# Patient Record
Sex: Male | Born: 1960 | Race: White | Hispanic: No | Marital: Married | Smoking: Never smoker
Health system: Southern US, Community
[De-identification: ages and names within clinical notes are randomized; demographics above are authoritative.]

## PROBLEM LIST (undated history)

## (undated) DIAGNOSIS — K7581 Nonalcoholic steatohepatitis (NASH): Secondary | ICD-10-CM

## (undated) DIAGNOSIS — L03115 Cellulitis of right lower limb: Secondary | ICD-10-CM

## (undated) DIAGNOSIS — E785 Hyperlipidemia, unspecified: Secondary | ICD-10-CM

## (undated) DIAGNOSIS — Z1211 Encounter for screening for malignant neoplasm of colon: Secondary | ICD-10-CM

## (undated) HISTORY — DX: Nonalcoholic steatohepatitis (NASH): K75.81

## (undated) HISTORY — DX: Hyperlipidemia, unspecified: E78.5

## (undated) HISTORY — DX: Cellulitis of right lower limb: L03.115

## (undated) HISTORY — DX: Encounter for screening for malignant neoplasm of colon: Z12.11

---

## 2015-04-12 ENCOUNTER — Ambulatory Visit: Payer: Self-pay | Admitting: Nurse Practitioner

## 2015-10-27 ENCOUNTER — Telehealth: Payer: Self-pay | Admitting: General Practice

## 2015-10-27 NOTE — Telephone Encounter (Signed)
Noted  

## 2015-10-27 NOTE — Telephone Encounter (Signed)
Patients previous PCP, [facility] no longer [facility]. I called the office to request records, and I was informed that they do not have a charge for the patient. They asked me to fax the form to [phone]. I am faxing in an attempt to find records that might be in their storage. At this point, however, there are no records to get for this patient.

## 2015-10-27 NOTE — Telephone Encounter (Signed)
Disregard

## 2015-10-27 NOTE — Telephone Encounter (Signed)
FYI

## 2015-10-28 ENCOUNTER — Encounter: Payer: Self-pay | Admitting: Family Medicine

## 2015-10-28 ENCOUNTER — Ambulatory Visit (INDEPENDENT_AMBULATORY_CARE_PROVIDER_SITE_OTHER): Payer: BC Managed Care – PPO | Admitting: Family Medicine

## 2015-10-28 VITALS — BP 123/76 | HR 70 | Temp 98.6°F | Resp 16 | Ht 67.0 in | Wt 206.8 lb

## 2015-10-28 DIAGNOSIS — R69 Illness, unspecified: Principal | ICD-10-CM

## 2015-10-28 DIAGNOSIS — J111 Influenza due to unidentified influenza virus with other respiratory manifestations: Secondary | ICD-10-CM

## 2015-10-28 NOTE — Patient Instructions (Signed)
Get otc generic robitussin DM OR Mucinex DM and use as directed on the packaging for cough and congestion. Use otc generic saline nasal spray 2-3 times per day to irrigate/moisturize your nasal passages.   

## 2015-10-28 NOTE — Progress Notes (Signed)
Office Note 10/28/2015  CC:  Chief Complaint  Patient presents with  . Establish Care  . URI    x 1 week    HPI:  Dean Aguirre is a 55 y.o. White male who is here to establish care and discuss recent resp illness. Patient's most recent primary MD: pt gave info to front office. Old records were not reviewed prior to or during today's visit.  Onset 4 days ago head pressure, some ST, body aches, cough, congestion in head, fever on/off.  Some intermittent feeling of SOB/chest tightness/wheezing.  No n/v/d. No HA, no RASH.  Missed work the last 3 days.  Feels improved last day or so. Nyquil and dayquil.  Drinking plenty of fluids.    Pt denies any PMH or PSH and is currently on no chronic medications. History reviewed. No pertinent past medical history.  History reviewed. No pertinent past surgical history.  Family History  Problem Relation Age of Onset  . Melanoma Father     Social History   Social History  . Marital Status: Married    Spouse Name: N/A  . Number of Children: N/A  . Years of Education: N/A   Occupational History  . Not on file.   Social History Main Topics  . Smoking status: Never Smoker   . Smokeless tobacco: Never Used  . Alcohol Use: Yes  . Drug Use: No  . Sexual Activity: Not on file   Other Topics Concern  . Not on file   Social History Narrative   Married, 2 stepchildren, one lives with him.   Educ: HS   Orig from [place], relocated approx 2012.   Occup: Plumber   No tob, typically has a beer with dinner.      No outpatient encounter prescriptions on file as of 10/28/2015.   No facility-administered encounter medications on file as of 10/28/2015.    No Known Allergies  ROS Review of Systems  Constitutional: Negative for fatigue.       See HPI  HENT:       See HPI  Eyes: Negative for visual disturbance.  Respiratory: Positive for cough.   Cardiovascular: Negative for chest pain.  Gastrointestinal: Negative for nausea and abdominal  pain.  Genitourinary: Negative for dysuria.  Musculoskeletal: Negative for back pain and joint swelling.  Skin: Negative for rash.  Neurological: Negative for weakness and headaches.  Hematological: Negative for adenopathy.    PE; Blood pressure 123/76, pulse 70, temperature 98.6 F (37 C), temperature source Oral, resp. rate 16, height 5\' 7"  (1.702 m), weight 206 lb 12 oz (93.781 kg), SpO2 94 %. VS: noted--normal. Gen: alert, NAD, NONTOXIC APPEARING. HEENT: eyes without injection, drainage, or swelling.  Ears: EACs clear, TMs with normal light reflex and landmarks.  Nose: Clear rhinorrhea, with some dried, crusty exudate adherent to mildly injected mucosa.  No purulent d/c.  No paranasal sinus TTP.  No facial swelling.  Throat and mouth without focal lesion.  No pharyngial swelling, erythema, or exudate.   Neck: supple, no LAD.   LUNGS: CTA bilat, nonlabored resps.   CV: RRR, no m/r/g. ABD: soft, NT/ND, BS normal, no mass/brut/HSM EXT: no c/c/e SKIN: no rash   Pertinent labs:  none  ASSESSMENT AND PLAN:  New pt; try to obtain old records.  1) Influenza-like illness, resolving. No sign of RAD or bacterial infection. Get otc generic robitussin DM OR Mucinex DM and use as directed on the packaging for cough and congestion. Use otc generic saline nasal  spray 2-3 times per day to irrigate/moisturize your nasal passages. Signs/symptoms to call or return for were reviewed and pt expressed understanding.  An After Visit Summary was printed and given to the patient.  Return if symptoms worsen or fail to improve.

## 2015-10-28 NOTE — Progress Notes (Signed)
Pre visit review using our clinic review tool, if applicable. No additional management support is needed unless otherwise documented below in the visit note. 

## 2016-10-22 DIAGNOSIS — K7581 Nonalcoholic steatohepatitis (NASH): Secondary | ICD-10-CM

## 2016-10-22 DIAGNOSIS — L03115 Cellulitis of right lower limb: Secondary | ICD-10-CM

## 2016-10-22 HISTORY — DX: Nonalcoholic steatohepatitis (NASH): K75.81

## 2016-10-22 HISTORY — DX: Cellulitis of right lower limb: L03.115

## 2016-11-12 ENCOUNTER — Ambulatory Visit (INDEPENDENT_AMBULATORY_CARE_PROVIDER_SITE_OTHER): Payer: BC Managed Care – PPO | Admitting: Family Medicine

## 2016-11-12 ENCOUNTER — Encounter: Payer: Self-pay | Admitting: Family Medicine

## 2016-11-12 VITALS — BP 142/84 | HR 119 | Temp 98.4°F | Resp 18 | Wt 219.0 lb

## 2016-11-12 DIAGNOSIS — R509 Fever, unspecified: Secondary | ICD-10-CM | POA: Diagnosis not present

## 2016-11-12 DIAGNOSIS — R52 Pain, unspecified: Secondary | ICD-10-CM | POA: Diagnosis not present

## 2016-11-12 DIAGNOSIS — R5383 Other fatigue: Secondary | ICD-10-CM | POA: Diagnosis not present

## 2016-11-12 DIAGNOSIS — R6889 Other general symptoms and signs: Secondary | ICD-10-CM | POA: Diagnosis not present

## 2016-11-12 LAB — POCT INFLUENZA A/B
Influenza A, POC: NEGATIVE
Influenza B, POC: NEGATIVE

## 2016-11-12 MED ORDER — OSELTAMIVIR PHOSPHATE 75 MG PO CAPS: 75.0000 mg | ORAL_CAPSULE | Freq: Two times a day (BID) | ORAL | 0 refills | Status: DC

## 2016-11-12 NOTE — Progress Notes (Signed)
Pre visit review using our clinic review tool, if applicable. No additional management support is needed unless otherwise documented below in the visit note. 

## 2016-11-12 NOTE — Progress Notes (Addendum)
OFFICE VISIT  11/12/2016   CC:  Chief Complaint  Patient presents with  . Fever    body aches, fatigue x1 day   HPI:    Patient is a 56 y.o.  male who presents for body aches and subjective fever. Onset yesterday morning, felt dizziness, very fatigued.  No cough or ST.  No stuffy nose or runny nose.   No HA.  No SOB.  NO urinary complaints.  No rash.   No swelling of any joints.   Took ibuprofen and alka seltzer tabs today.  No n/v/d.    No known sick contacts. No flu vaccine this season.  History reviewed. No pertinent past medical history.  History reviewed. No pertinent surgical history.  MEDS: none  No Known Allergies  ROS As per HPI  PE: Blood pressure (!) 142/84, pulse (!) 119, temperature 98.4 F (36.9 C), temperature source Temporal, resp. rate 18, weight 219 lb (99.3 kg), SpO2 94 %. VS: noted. Gen: alert, NAD, NONTOXIC APPEARING.  Smiling, occ cracking a joke. HEENT: eyes without injection, drainage, or swelling.  Ears: EACs clear, TMs with normal light reflex and landmarks.  Nose: Clear rhinorrhea, with some dried, crusty exudate adherent to mildly injected mucosa.  No purulent d/c.  No paranasal sinus TTP.  No facial swelling.  Throat and mouth without focal lesion.  No pharyngial swelling, erythema, or exudate.   Neck: supple, no LAD.  No nuchal rigidity. LUNGS: CTA bilat, nonlabored resps.   CV: RRR, no m/r/g. EXT: no c/c/e SKIN: no rash  LABS:  Rapid flu A/B: NEG  IMPRESSION AND PLAN:  Viral syndrome suspected: his only sx's are subjective fever, body aches, and fatigue at this time. No localizing symptoms at this time, but given that we are in the middle of flu season and he is presenting w/in 24-48h of his symptoms onset, most likely dx is influenza-like illness. Given high occurrence of false neg with rapid flu testing, will go ahead and start tamiflu. However, I told him to return for recheck in 2d if he has still has fever but has not developed any  cough or ST.  An After Visit Summary was printed and given to the patient.  FOLLOW UP: Return in about 2 days (around 11/14/2016) for f/u fever.  Signed:  Santiago BumpersPhil McGowen, MD           11/12/2016

## 2016-11-14 ENCOUNTER — Ambulatory Visit (INDEPENDENT_AMBULATORY_CARE_PROVIDER_SITE_OTHER): Payer: BC Managed Care – PPO | Admitting: Family Medicine

## 2016-11-14 ENCOUNTER — Encounter: Payer: Self-pay | Admitting: Family Medicine

## 2016-11-14 VITALS — BP 108/74 | HR 85 | Temp 98.6°F | Resp 16 | Ht 67.0 in | Wt 215.5 lb

## 2016-11-14 DIAGNOSIS — B349 Viral infection, unspecified: Secondary | ICD-10-CM | POA: Diagnosis not present

## 2016-11-14 NOTE — Progress Notes (Signed)
Pre visit review using our clinic review tool, if applicable. No additional management support is needed unless otherwise documented below in the visit note. 

## 2016-11-14 NOTE — Progress Notes (Signed)
OFFICE VISIT  11/14/2016   CC:  Chief Complaint  Patient presents with  . Follow-up    fever     HPI:    Patient is a 56 y.o. Caucasian male who presents for 2 day f/u fever, body aches, and fatigue. He had no respiratory or other symptoms, so I was hesitant to say he had the flu.  At any rate, I did start him on tamiflu and told him to return if he did not get any respiratory or other localizing sx's in 2d. Checking temp regularly and no longer having any temp > 100 and no subjective fever. No new sx's except a little bit of nasal mucous/PND/stuffy head.   No cough or ST or HA.  Body aches and fatigue is improved some. He did NOT start the tamiflu.   History reviewed. No pertinent past medical history.  History reviewed. No pertinent surgical history.  Outpatient Medications Prior to Visit  Medication Sig Dispense Refill  . oseltamivir (TAMIFLU) 75 MG capsule Take 1 capsule (75 mg total) by mouth 2 (two) times daily. (Patient not taking: Reported on 11/14/2016) 10 capsule 0   No facility-administered medications prior to visit.     No Known Allergies  ROS As per HPI  PE: Blood pressure 108/74, pulse 85, temperature 98.6 F (37 C), temperature source Oral, resp. rate 16, height 5\' 7"  (1.702 m), weight 215 lb 8 oz (97.8 kg), SpO2 97 %. VS: noted--normal. Gen: alert, NAD, NONTOXIC APPEARING. HEENT: eyes without injection, drainage, or swelling.  Ears: EACs clear, TMs with normal light reflex and landmarks.  Nose: mildly injected mucosa.  No purulent d/c.  No paranasal sinus TTP.  No facial swelling.  Throat and mouth without focal lesion.  No pharyngial swelling, erythema, or exudate.   Neck: supple, no LAD.   LUNGS: CTA bilat, nonlabored resps.   CV: RRR, no m/r/g. EXT: no c/c/e SKIN: no rash    LABS:  none  IMPRESSION AND PLAN:  Viral syndrome: he has now developed a bit of URI sx's, and the other systemic sx's he was having are resolving/improving.  Continue  symptomatic care, push fluids, rest. Note for out of work 1/22/-126, 2018. Signs/symptoms to call or return for were reviewed and pt expressed understanding.  An After Visit Summary was printed and given to the patient.  FOLLOW UP: Return if symptoms worsen or fail to improve.  Signed:  Santiago BumpersPhil Audianna Landgren, MD           11/14/2016

## 2016-11-16 ENCOUNTER — Ambulatory Visit (INDEPENDENT_AMBULATORY_CARE_PROVIDER_SITE_OTHER): Payer: BC Managed Care – PPO | Admitting: Family Medicine

## 2016-11-16 ENCOUNTER — Encounter: Payer: Self-pay | Admitting: Family Medicine

## 2016-11-16 VITALS — BP 110/72 | HR 102 | Temp 101.8°F | Resp 16 | Ht 67.0 in | Wt 217.8 lb

## 2016-11-16 DIAGNOSIS — L03115 Cellulitis of right lower limb: Secondary | ICD-10-CM

## 2016-11-16 DIAGNOSIS — R5081 Fever presenting with conditions classified elsewhere: Secondary | ICD-10-CM | POA: Diagnosis not present

## 2016-11-16 MED ORDER — CEFTRIAXONE SODIUM 1 G IJ SOLR
1.0000 g | Freq: Once | INTRAMUSCULAR | Status: AC
  Administered 2016-11-16: 1 g via INTRAMUSCULAR

## 2016-11-16 MED ORDER — HYDROCODONE-ACETAMINOPHEN 5-325 MG PO TABS: 1.0000 | ORAL_TABLET | Freq: Four times a day (QID) | ORAL | 0 refills | Status: DC | PRN

## 2016-11-16 MED ORDER — CEFDINIR 300 MG PO CAPS: 300.0000 mg | ORAL_CAPSULE | Freq: Two times a day (BID) | ORAL | 0 refills | Status: DC

## 2016-11-16 NOTE — Addendum Note (Signed)
Addended by: Smitty KnudsenSUTHERLAND, HEATHER K on: 11/16/2016 03:06 PM   Modules accepted: Orders

## 2016-11-16 NOTE — Progress Notes (Signed)
OFFICE VISIT  11/16/2016   CC:  Chief Complaint  Patient presents with  . Leg Swelling    right lower leg x 1-2 days, with discoleration   HPI:    Patient is a 56 y.o. Caucasian male who presents accompanied by his wife today for right leg swelling. Onset 2 d/a, swelling and redness on anterior surface.  Still with fevers.  Redness, swelling is moving up leg to the level of the knee now.  Moderate pain RLL, causes signif limp to walk. Last took ibup 3 hours ago.  ROS: no n/v, no dizziness.  He has had some diffuse body aches and HA the last 5d.  A little stuffy/runny nose last few days.  No cough, no ST.   History reviewed. No pertinent past medical history. No hx of cellulitis. No renal impairment.  History reviewed. No pertinent surgical history. No surgeries.  MEDS: tylenol alt w/ibuprofen  No Known Allergies  ROS As per HPI  PE: Blood pressure 110/72, pulse (!) 102, temperature (!) 101.8 F (38.8 C), temperature source Oral, resp. rate 16, height 5\' 7"  (1.702 m), weight 217 lb 12 oz (98.8 kg), SpO2 96 %. Gen: Alert, nontoxic-appearing.  Patient is oriented to person, place, time, and situation. AFFECT: pleasant, lucid thought and speech. Right lower leg: anterior aspect with erythema and 2+ pitting edema from just above ankle extending up to patella.  No skin breakdown.  No subQ swelling or nodularity to suggest abscess.  The erythematous area is warm and tender.  I outlined the erythematous region in blue ink today.  LABS:  none  IMPRESSION AND PLAN:  Right lower leg cellulitis. Rocephin 2g IM in office today. Start cefdinir 300 mg bid x 10d tomorrow. Vicodin 5/325, 1-2 q6h prn, #20.   Ibup 600 mg q6h prn fever. Go to ED if worsening over the weekend, otherwise I'll see him in office for f/u in 3d.  An After Visit Summary was printed and given to the patient.  FOLLOW UP: Return in about 3 days (around 11/19/2016) for f/u cellulitis.  Signed:  Santiago BumpersPhil McGowen,  MD           11/16/2016

## 2016-11-16 NOTE — Progress Notes (Signed)
Pre visit review using our clinic review tool, if applicable. No additional management support is needed unless otherwise documented below in the visit note. 

## 2016-11-19 ENCOUNTER — Encounter: Payer: Self-pay | Admitting: Family Medicine

## 2016-11-19 ENCOUNTER — Encounter (HOSPITAL_COMMUNITY): Payer: Self-pay | Admitting: Neurology

## 2016-11-19 ENCOUNTER — Ambulatory Visit (INDEPENDENT_AMBULATORY_CARE_PROVIDER_SITE_OTHER): Payer: BC Managed Care – PPO | Admitting: Family Medicine

## 2016-11-19 ENCOUNTER — Inpatient Hospital Stay (HOSPITAL_COMMUNITY)
Admission: EM | Admit: 2016-11-19 | Discharge: 2016-11-24 | DRG: 603 | Disposition: A | Discharge status: Home / Self Care | Payer: BC Managed Care – PPO | Attending: Internal Medicine | Admitting: Internal Medicine

## 2016-11-19 VITALS — BP 116/79 | HR 91 | Temp 99.6°F | Resp 16 | Ht 67.0 in | Wt 218.0 lb

## 2016-11-19 DIAGNOSIS — K760 Fatty (change of) liver, not elsewhere classified: Secondary | ICD-10-CM | POA: Diagnosis present

## 2016-11-19 DIAGNOSIS — R739 Hyperglycemia, unspecified: Secondary | ICD-10-CM

## 2016-11-19 DIAGNOSIS — R5081 Fever presenting with conditions classified elsewhere: Secondary | ICD-10-CM

## 2016-11-19 DIAGNOSIS — E669 Obesity, unspecified: Secondary | ICD-10-CM | POA: Diagnosis present

## 2016-11-19 DIAGNOSIS — L0390 Cellulitis, unspecified: Secondary | ICD-10-CM | POA: Diagnosis present

## 2016-11-19 DIAGNOSIS — L03115 Cellulitis of right lower limb: Secondary | ICD-10-CM | POA: Diagnosis present

## 2016-11-19 DIAGNOSIS — Z6834 Body mass index (BMI) 34.0-34.9, adult: Secondary | ICD-10-CM

## 2016-11-19 DIAGNOSIS — L0100 Impetigo, unspecified: Secondary | ICD-10-CM | POA: Diagnosis not present

## 2016-11-19 DIAGNOSIS — L089 Local infection of the skin and subcutaneous tissue, unspecified: Secondary | ICD-10-CM

## 2016-11-19 DIAGNOSIS — R609 Edema, unspecified: Secondary | ICD-10-CM

## 2016-11-19 DIAGNOSIS — M7989 Other specified soft tissue disorders: Secondary | ICD-10-CM | POA: Diagnosis present

## 2016-11-19 DIAGNOSIS — R945 Abnormal results of liver function studies: Secondary | ICD-10-CM | POA: Diagnosis present

## 2016-11-19 DIAGNOSIS — R7401 Elevation of levels of liver transaminase levels: Secondary | ICD-10-CM

## 2016-11-19 DIAGNOSIS — R740 Nonspecific elevation of levels of transaminase and lactic acid dehydrogenase [LDH]: Secondary | ICD-10-CM | POA: Diagnosis present

## 2016-11-19 LAB — CBC WITH DIFFERENTIAL/PLATELET
BASOS ABS: 0.1 10*3/uL (ref 0.0–0.1)
Basophils Relative: 1 %
Eosinophils Absolute: 0.1 10*3/uL (ref 0.0–0.7)
Eosinophils Relative: 1 %
HCT: 40.6 % (ref 39.0–52.0)
Hemoglobin: 13.5 g/dL (ref 13.0–17.0)
LYMPHS PCT: 12 %
Lymphs Abs: 1.6 10*3/uL (ref 0.7–4.0)
MCH: 28.5 pg (ref 26.0–34.0)
MCHC: 33.3 g/dL (ref 30.0–36.0)
MCV: 85.7 fL (ref 78.0–100.0)
MONOS PCT: 7 %
Monocytes Absolute: 0.9 10*3/uL (ref 0.1–1.0)
NEUTROS ABS: 10.3 10*3/uL — AB (ref 1.7–7.7)
NEUTROS PCT: 79 %
PLATELETS: 360 10*3/uL (ref 150–400)
RBC: 4.74 MIL/uL (ref 4.22–5.81)
RDW: 13.4 % (ref 11.5–15.5)
WBC: 13 10*3/uL — ABNORMAL HIGH (ref 4.0–10.5)

## 2016-11-19 LAB — COMPREHENSIVE METABOLIC PANEL
ALBUMIN: 2.8 g/dL — AB (ref 3.5–5.0)
ALT: 98 U/L — ABNORMAL HIGH (ref 17–63)
ANION GAP: 10 (ref 5–15)
AST: 75 U/L — ABNORMAL HIGH (ref 15–41)
Alkaline Phosphatase: 168 U/L — ABNORMAL HIGH (ref 38–126)
BILIRUBIN TOTAL: 1.8 mg/dL — AB (ref 0.3–1.2)
BUN: 14 mg/dL (ref 6–20)
CO2: 26 mmol/L (ref 22–32)
Calcium: 9.1 mg/dL (ref 8.9–10.3)
Chloride: 98 mmol/L — ABNORMAL LOW (ref 101–111)
Creatinine, Ser: 0.98 mg/dL (ref 0.61–1.24)
GFR calc non Af Amer: >60 mL/min (ref >60)
GLUCOSE: 116 mg/dL — AB (ref 65–99)
POTASSIUM: 3.8 mmol/L (ref 3.5–5.1)
SODIUM: 134 mmol/L — AB (ref 135–145)
Total Protein: 7.6 g/dL (ref 6.5–8.1)

## 2016-11-19 LAB — I-STAT CG4 LACTIC ACID, ED
LACTIC ACID, VENOUS: 1.38 mmol/L (ref 0.5–1.9)
Lactic Acid, Venous: 0.97 mmol/L (ref 0.5–1.9)

## 2016-11-19 LAB — GLUCOSE, CAPILLARY: GLUCOSE-CAPILLARY: 162 mg/dL — AB (ref 65–99)

## 2016-11-19 MED ORDER — ACETAMINOPHEN 325 MG PO TABS: 650.0000 mg | ORAL_TABLET | Freq: Four times a day (QID) | ORAL | Status: DC | PRN

## 2016-11-19 MED ORDER — MORPHINE SULFATE (PF) 4 MG/ML IV SOLN
6.0000 mg | Freq: Once | INTRAVENOUS | Status: AC
  Administered 2016-11-19: 6 mg via INTRAVENOUS
  Filled 2016-11-19: qty 2

## 2016-11-19 MED ORDER — SODIUM CHLORIDE 0.9 % IV BOLUS (SEPSIS)
1000.0000 mL | Freq: Once | INTRAVENOUS | Status: AC
  Administered 2016-11-19: 1000 mL via INTRAVENOUS

## 2016-11-19 MED ORDER — FENTANYL CITRATE (PF) 100 MCG/2ML IJ SOLN: 25.0000 ug | INTRAMUSCULAR | Status: DC | PRN

## 2016-11-19 MED ORDER — VANCOMYCIN HCL IN DEXTROSE 1-5 GM/200ML-% IV SOLN
1000.0000 mg | Freq: Once | INTRAVENOUS | Status: AC
  Administered 2016-11-19: 1000 mg via INTRAVENOUS
  Filled 2016-11-19: qty 200

## 2016-11-19 MED ORDER — ONDANSETRON HCL 4 MG PO TABS: 4.0000 mg | ORAL_TABLET | Freq: Four times a day (QID) | ORAL | Status: DC | PRN

## 2016-11-19 MED ORDER — SODIUM CHLORIDE 0.9 % IV SOLN
INTRAVENOUS | Status: DC
  Administered 2016-11-19 – 2016-11-20 (×2): via INTRAVENOUS

## 2016-11-19 MED ORDER — KETOROLAC TROMETHAMINE 30 MG/ML IJ SOLN
30.0000 mg | Freq: Four times a day (QID) | INTRAMUSCULAR | Status: DC | PRN
  Administered 2016-11-19 – 2016-11-24 (×9): 30 mg via INTRAVENOUS
  Filled 2016-11-19 (×9): qty 1

## 2016-11-19 MED ORDER — ONDANSETRON HCL 4 MG/2ML IJ SOLN
4.0000 mg | Freq: Four times a day (QID) | INTRAMUSCULAR | Status: DC | PRN
Start: 2016-11-19 — End: 2016-11-24

## 2016-11-19 MED ORDER — ENOXAPARIN SODIUM 40 MG/0.4ML ~~LOC~~ SOLN
40.0000 mg | SUBCUTANEOUS | Status: DC
  Administered 2016-11-19 – 2016-11-23 (×5): 40 mg via SUBCUTANEOUS
  Filled 2016-11-19 (×5): qty 0.4

## 2016-11-19 MED ORDER — MUPIROCIN 2 % EX OINT
TOPICAL_OINTMENT | Freq: Two times a day (BID) | CUTANEOUS | Status: AC
  Administered 2016-11-19 – 2016-11-22 (×6): via TOPICAL
  Filled 2016-11-19 (×2): qty 22

## 2016-11-19 MED ORDER — NYSTATIN 100000 UNIT/GM EX POWD
Freq: Two times a day (BID) | CUTANEOUS | Status: DC
  Administered 2016-11-19 – 2016-11-24 (×10): via TOPICAL
  Filled 2016-11-19: qty 15

## 2016-11-19 MED ORDER — ACETAMINOPHEN 650 MG RE SUPP: 650.0000 mg | Freq: Four times a day (QID) | RECTAL | Status: DC | PRN

## 2016-11-19 MED ORDER — CLINDAMYCIN PHOSPHATE 600 MG/50ML IV SOLN
600.0000 mg | Freq: Three times a day (TID) | INTRAVENOUS | Status: DC
  Administered 2016-11-19 – 2016-11-21 (×6): 600 mg via INTRAVENOUS
  Filled 2016-11-19 (×6): qty 50

## 2016-11-19 NOTE — H&P (Addendum)
History and Physical    Dean Aguirre ZOX:[id number] DOB: 04/23/1961 DOA: 11/19/2016  PCP: Dean MassedMCGOWEN,Dean H, MD Patient coming from: Home  Chief Complaint: RLE cellulitis   HPI: Dean ChimesRichard Aguirre is a 56 y.o. male without contributory past medical history. Patient percent with approximately one-week history of right lower leg swelling, redness and hypersensitivity. Denies any recent prolonged travel or right lower extremity injury or sedentary lifestyle. Patient also endorses several day history of fevers and myalgias. Seen by his primary care physician on 11/12/2016, and diagnosed with fever of unknown origin. Prescribed Tamiflu after patient had a return visit on 11/14/2016. Patient did not start this medication as he felt like it was not consistent with his concerns. Patient prescribed Omnicef on 11/16/2016 for the right lower extremity cellulitis. This was started on 11/17/2016. Patient states that the redness swelling and pain of his right lower extremity continues to get worse. Some relief with ibuprofen and elevation and rest. Denies any chest pain, palpitations, shortness of breath, nausea, vomiting, abdominal pain, dysuria, frequency, back pain, neck stiffness, headache.   ED Course: Right lower extremity. Be extremely edematous and cellulitic. Started on morphine, fluids and vancomycin.   Review of Systems: As per HPI otherwise 10 point review of systems negative.   Ambulatory Status: No restrictions at baseline  History reviewed. No pertinent past medical history.  History reviewed. No pertinent surgical history.  Social History   Social History  . Marital status: Married    Spouse name: N/A  . Number of children: N/A  . Years of education: N/A   Occupational History  . Not on file.   Social History Main Topics  . Smoking status: Never Smoker  . Smokeless tobacco: Never Used  . Alcohol use Yes  . Drug use: No  . Sexual activity: Not on file   Other Topics Concern  .  Not on file   Social History Narrative   Married, 2 stepchildren, one lives with him.   Educ: HS   Orig from [place], relocated approx 2012.   Occup: Plumber   No tob, typically has a beer with dinner.      No Known Allergies  Family History  Problem Relation Age of Onset  . Melanoma Father     Prior to Admission medications   Medication Sig Start Date End Date Taking? Authorizing Provider  cefdinir (OMNICEF) 300 MG capsule Take 1 capsule (300 mg total) by mouth 2 (two) times daily. 11/16/16   Dean MassedPhilip H McGowen, MD  HYDROcodone-acetaminophen (NORCO/VICODIN) 5-325 MG tablet Take 1-2 tablets by mouth every 6 (six) hours as needed for moderate pain. 11/16/16   Dean MassedPhilip H McGowen, MD    Physical Exam: Vitals:   11/19/16 1205 11/19/16 1430  BP: 126/88 116/71  Pulse: 94 89  Resp: 22 18  Temp: 98.5 F (36.9 C) 100 F (37.8 C)  TempSrc: Oral Oral  SpO2: 98% 95%  Weight: 98.6 kg (217 lb 6 oz)   Height: 5\' 7"  (1.702 m)      General:  Appears calm and comfortable Eyes:  PERRL, EOMI, normal lids, iris ENT:  grossly normal hearing, lips & tongue, mmm Neck:  no LAD, masses or thyromegaly Cardiovascular:  RRR, no m/r/g. .  Respiratory:  CTA bilaterally, no w/r/r. Normal respiratory effort. Abdomen:  soft, ntnd, NABS Skin:  Right lower extremity with marked induration and erythema extending from the ankle to the mid right thigh. Previous area of skin demarcation noted with extension beyond this. Macerated skin between all of  the patient's toes in the right lower foot. Musculoskeletal:  grossly normal tone BUE/BLE, good ROM, no bony abnormality Psychiatric:  grossly normal mood and affect, speech fluent and appropriate, AOx3 Neurologic:  CN 2-12 grossly intact, moves all extremities in coordinated fashion, sensation intact  Labs on Admission: I have personally reviewed following labs and imaging studies  CBC:  Recent Labs Lab 11/19/16 1218  WBC 13.0*  NEUTROABS 10.3*  HGB 13.5  HCT  40.6  MCV 85.7  PLT 360   Basic Metabolic Panel:  Recent Labs Lab 11/19/16 1218  NA 134*  K 3.8  CL 98*  CO2 26  GLUCOSE 116*  BUN 14  CREATININE 0.98  CALCIUM 9.1   GFR: Estimated Creatinine Clearance: 97.5 mL/min (by C-G formula based on SCr of 0.98 mg/dL). Liver Function Tests:  Recent Labs Lab 11/19/16 1218  AST 75*  ALT 98*  ALKPHOS 168*  BILITOT 1.8*  PROT 7.6  ALBUMIN 2.8*   No results for input(s): LIPASE, AMYLASE in the last 168 hours. No results for input(s): AMMONIA in the last 168 hours. Coagulation Profile: No results for input(s): INR, PROTIME in the last 168 hours. Cardiac Enzymes: No results for input(s): CKTOTAL, CKMB, CKMBINDEX, TROPONINI in the last 168 hours. BNP (last 3 results) No results for input(s): PROBNP in the last 8760 hours. HbA1C: No results for input(s): HGBA1C in the last 72 hours. CBG: No results for input(s): GLUCAP in the last 168 hours. Lipid Profile: No results for input(s): CHOL, HDL, LDLCALC, TRIG, CHOLHDL, LDLDIRECT in the last 72 hours. Thyroid Function Tests: No results for input(s): TSH, T4TOTAL, FREET4, T3FREE, THYROIDAB in the last 72 hours. Anemia Panel: No results for input(s): VITAMINB12, FOLATE, FERRITIN, TIBC, IRON, RETICCTPCT in the last 72 hours. Urine analysis: No results found for: COLORURINE, APPEARANCEUR, LABSPEC, PHURINE, GLUCOSEU, HGBUR, BILIRUBINUR, KETONESUR, PROTEINUR, UROBILINOGEN, NITRITE, LEUKOCYTESUR  Creatinine Clearance: Estimated Creatinine Clearance: 97.5 mL/min (by C-G formula based on SCr of 0.98 mg/dL).  Sepsis Labs: @LABRCNTIP (procalcitonin:4,lacticidven:4) )No results found for this or any previous visit (from the past 240 hour(s)).   Radiological Exams on Admission: No results found.  EKG: Independently reviewed. Ordered  Assessment/Plan Active Problems:   Cellulitis   Transaminitis   Hyperglycemia   Cellulitis: failed outpt therapy. Concern for early sepsis and possible  bacteremia. Rapid progressing suggestive of MRSA. Fortunately no h/o PVD/DM/HTN/DVT/CHF which may complicate treatment course. Omnicef for 36hrs in outpt setting prior to admission. Lactate acid upturning from 0.97-1.38. - Monitor new area of demarcation - IV clindamycin - BCX (concern for bacteremia, unfortunately obtained after receiving Vancomycin in ED) - Pain control  Transaminitis: AST 75, ALT 98, alkaline phosphatase 168, albumin 2.8, total bilirubin 1.8. Last alcoholic drink 1 week ago. History of hepatitis. Findings suggestive of systemic effects from cellulitis. - Hepatitis panel - IVF - Repeat CMP in a.m.  Hyperglycemia: 115 on admission - CBG ACHS - Consider A1c if remains elevated.   Interdigit foot infection: skin macerated and wet appearing w/ some honey crusting. Difficult to determine if solely bacterial or if has addition of yeast. - Mupirocin ointment BID x3 days - Nystatin   DVT prophylaxis: lovenox  Code Status: full  Family Communication: wife  Disposition Plan: pendingi mprovmenent in cellulitis  Consults called: none  Admission status: inpt    Hunner Garcon J MD [facility]  If 7PM-7AM, please contact night-coverage [email or web address] Password [facility]  11/19/2016, 5:24 PM

## 2016-11-19 NOTE — ED Triage Notes (Addendum)
Pt reports being treated by PCP for right lower leg cellulitis, is taking oral antibiotic cefdinir, but it is getting worse and has spread above his knee. Has been running fevers at home, today 98.5. Is alert x 4, ambulatory. Swelling and redness to right lower leg extending above knee.

## 2016-11-19 NOTE — Patient Instructions (Signed)
Go to the ED for further evaluation.

## 2016-11-19 NOTE — Progress Notes (Signed)
OFFICE VISIT  11/19/2016   CC:  Chief Complaint  Patient presents with  . Follow-up    Cellulitis   HPI:    Patient is a 56 y.o. Caucasian male who presents for 3 day f/u right lower leg cellulitis. He is s/p rocephin injection in the office 11/16/16 and has been on cefdinir 300 mg bid since then. Continues to have fever 101-102 range, lots of pain in leg. The redness is extending up his leg more. Taking ibuprofen regularly.  Had some nausea from the pain pills but no vomiting.     PMH: no signif problems  PSH: none  MEDS: Outpatient Medications Prior to Visit  Medication Sig Dispense Refill  . cefdinir (OMNICEF) 300 MG capsule Take 1 capsule (300 mg total) by mouth 2 (two) times daily. 20 capsule 0  . HYDROcodone-acetaminophen (NORCO/VICODIN) 5-325 MG tablet Take 1-2 tablets by mouth every 6 (six) hours as needed for moderate pain. 20 tablet 0   No facility-administered medications prior to visit.     No Known Allergies  ROS As per HPI  PE: Blood pressure 116/79, pulse 91, temperature 99.6 F (37.6 C), temperature source Oral, resp. rate 16, height 5\' 7"  (1.702 m), weight 218 lb (98.9 kg), SpO2 92 %. Gen: Alert, well appearing.  Patient is oriented to person, place, time, and situation. AFFECT: pleasant, lucid thought and speech. Walks with significant limp. Right leg: erythema/violacious hue from ankle up to knee anteriorly, with extension into back and side of thigh. +Warmth, +tenderness, +diffuse pitting edema in the area of erythema.   No subQ fluctuance or nodularity to suggest an abscess.  LABS:  none  IMPRESSION AND PLAN:  Right lower leg cellulitis; worsening the last 48-72 hours despite outpt antibiotics. I recommended pt proceed to the ED for further evaluation and likely admission to the hospital. He expressed understanding and was agreeable to this plan.  An After Visit Summary was printed and given to the patient.  FOLLOW UP: to be determined based  on ED/hosp eval  Signed:  Santiago BumpersPhil McGowen, MD           11/19/2016

## 2016-11-19 NOTE — Progress Notes (Signed)
Pre visit review using our clinic review tool, if applicable. No additional management support is needed unless otherwise documented below in the visit note. 

## 2016-11-20 LAB — COMPREHENSIVE METABOLIC PANEL
ALT: 139 U/L — ABNORMAL HIGH (ref 17–63)
AST: 114 U/L — AB (ref 15–41)
Albumin: 2.3 g/dL — ABNORMAL LOW (ref 3.5–5.0)
Alkaline Phosphatase: 174 U/L — ABNORMAL HIGH (ref 38–126)
Anion gap: 9 (ref 5–15)
BUN: 14 mg/dL (ref 6–20)
CHLORIDE: 104 mmol/L (ref 101–111)
CO2: 25 mmol/L (ref 22–32)
Calcium: 8.7 mg/dL — ABNORMAL LOW (ref 8.9–10.3)
Creatinine, Ser: 0.98 mg/dL (ref 0.61–1.24)
GFR calc Af Amer: >60 mL/min (ref >60)
Glucose, Bld: 112 mg/dL — ABNORMAL HIGH (ref 65–99)
POTASSIUM: 4 mmol/L (ref 3.5–5.1)
SODIUM: 138 mmol/L (ref 135–145)
Total Bilirubin: 1.8 mg/dL — ABNORMAL HIGH (ref 0.3–1.2)
Total Protein: 6.5 g/dL (ref 6.5–8.1)

## 2016-11-20 LAB — HEPATITIS PANEL, ACUTE
HCV Ab: <0.1 s/co ratio (ref 0.0–0.9)
Hep A IgM: NEGATIVE
Hep B C IgM: NEGATIVE
Hepatitis B Surface Ag: NEGATIVE

## 2016-11-20 LAB — CBC
HEMATOCRIT: 36.8 % — AB (ref 39.0–52.0)
Hemoglobin: 12 g/dL — ABNORMAL LOW (ref 13.0–17.0)
MCH: 28.2 pg (ref 26.0–34.0)
MCHC: 32.6 g/dL (ref 30.0–36.0)
MCV: 86.6 fL (ref 78.0–100.0)
Platelets: 357 10*3/uL (ref 150–400)
RBC: 4.25 MIL/uL (ref 4.22–5.81)
RDW: 13.8 % (ref 11.5–15.5)
WBC: 12.5 10*3/uL — AB (ref 4.0–10.5)

## 2016-11-20 LAB — GLUCOSE, CAPILLARY: GLUCOSE-CAPILLARY: 147 mg/dL — AB (ref 65–99)

## 2016-11-20 NOTE — Progress Notes (Signed)
PROGRESS NOTE        PATIENT DETAILS Name: Dean Aguirre Age: 56 y.o. Sex: male Date of Birth: November 19, 1960 Admit Date: 11/19/2016 Admitting Physician Ozella Rocks, MD ZOX:WRUEAVW,UJWJXB H, MD  Brief Narrative: Patient is a 56 y.o. male with no significant PMH. Presented to ED on 11/19/16 with 1 wk h/o right lower leg swelling, erythema, fever and myalgias. Received Omnicef on 11/16/16 for right lower extremity cellulitis, but swelling and redness continued to progress. Admitted for further evaluation and management. See details below.   Subjective: Patient is in a pleasant mood this morning accompanied by wife, states pain is controlled with morphine. Both patient and spouse claims that right lower extremity looks better and less red compared to yesterday.  Assesment/Plan:  Cellulitis of RLE: Slowly improving-less erythema, leukocytosis downtrending. He has a nontoxic appearance and is afebrile. He failed outpatient treatment with Omnicef.plans are to continue with clindamycin, have encouraged patient to keep his left lower extremity elevated. He does not have any areas in his right lower extremity that is suggestive of an abscess or deep infection. Plans are to follow clinically.Follow blood cultures  Transaminitis: LFTs are slightly elevated compared to yesterday, acute hepatitis panel negative. Could be secondary to above. Plans are to follow LFTs, if needed we could pursue RUQ ultrasound.    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 Encourage lifestyle changes before he begins to see chronic diseases develop, see above.   Hyperglycemia: No h/o Dm. CBG is 112 today, continue POCT CBG QID. Check A1c  DVT Prophylaxis: Prophylactic Lovenox  Code Status: Full code  Family Communication: Spouse at bedside  Disposition Plan: Remain inpatient  Antimicrobial agents: Anti-infectives    Start     Dose/Rate Route Frequency Ordered Stop   11/19/16 1730  clindamycin (CLEOCIN)  IVPB 600 mg     600 mg 100 mL/hr over 30 Minutes Intravenous Every 8 hours 11/19/16 1723     11/19/16 1315  vancomycin (VANCOCIN) IVPB 1000 mg/200 mL premix     1,000 mg 200 mL/hr over 60 Minutes Intravenous  Once 11/19/16 1307 11/19/16 1433      Procedures: None at this time  CONSULTS: None at this time  Time spent: 25 minutes-Greater than 50% of this time was spent in counseling, explanation of diagnosis, planning of further management, and coordination of care.  MEDICATIONS: Scheduled Meds: . clindamycin (CLEOCIN) IV  600 mg Intravenous Q8H  . enoxaparin (LOVENOX) injection  40 mg Subcutaneous Q24H  . mupirocin ointment   Topical BID  . nystatin   Topical BID   Continuous Infusions: . sodium chloride 100 mL/hr at 11/20/16 0557   PRN Meds:.acetaminophen **OR** acetaminophen, fentaNYL (SUBLIMAZE) injection, ketorolac, ondansetron **OR** ondansetron (ZOFRAN) IV   PHYSICAL EXAM: Vital signs: Vitals:   11/19/16 1430 11/19/16 2005 11/19/16 2044 11/20/16 0437  BP: 116/71 120/82 136/79 119/75  Pulse: 89 92 92 86  Resp: 18 18 19 19   Temp: 100 F (37.8 C)  100 F (37.8 C) 99.1 F (37.3 C)  TempSrc: Oral  Oral Oral  SpO2: 95% 97% 97% 97%  Weight:      Height:       Filed Weights   11/19/16 1205  Weight: 98.6 kg (217 lb 6 oz)   Body mass index is 34.05 kg/m.   General appearance: Awake, alert, not in any distress. Speech Clear. Not toxic  Looking Eyes: PERRLA, no scleral icterus. Pink conjunctiva HEENT: Atraumatic and Normocephalic Neck: Supple, no JVD. No cervical lymphadenopathy. No thyromegaly Resp: Good air entry bilaterally, no added sounds  CVS: S1 S2 regular, no murmurs.  GI: Bowel sounds present, Non tender and not distended with no gaurding, rigidity or rebound.No organomegaly Extremities: B/L both legs are warm to touch. RLE is erythematous, edematous circumferentially from ankle to slightly above knee(See pictures below). No area of induration or  fluctuation evident in the right lower extremity Neurology: Speech clear, Non focal, sensation is grossly intact. Psychiatric: Normal judgment and insight. AAO x 3. Normal mood. Musculoskeletal: No digital cyanosis Skin: No Rash, warm and dry. RLE Picture shown below     I have personally reviewed following labs and imaging studies  LABORATORY DATA: CBC:  Recent Labs Lab 11/19/16 1218 11/20/16 0530  WBC 13.0* 12.5*  NEUTROABS 10.3*  --   HGB 13.5 12.0*  HCT 40.6 36.8*  MCV 85.7 86.6  PLT 360 357    Basic Metabolic Panel:  Recent Labs Lab 11/19/16 1218 11/20/16 0530  NA 134* 138  K 3.8 4.0  CL 98* 104  CO2 26 25  GLUCOSE 116* 112*  BUN 14 14  CREATININE 0.98 0.98  CALCIUM 9.1 8.7*    GFR: Estimated Creatinine Clearance: 97.5 mL/min (by C-G formula based on SCr of 0.98 mg/dL).  Liver Function Tests:  Recent Labs Lab 11/19/16 1218 11/20/16 0530  AST 75* 114*  ALT 98* 139*  ALKPHOS 168* 174*  BILITOT 1.8* 1.8*  PROT 7.6 6.5  ALBUMIN 2.8* 2.3*   No results for input(s): LIPASE, AMYLASE in the last 168 hours. No results for input(s): AMMONIA in the last 168 hours.  Coagulation Profile: No results for input(s): INR, PROTIME in the last 168 hours.  Cardiac Enzymes: No results for input(s): CKTOTAL, CKMB, CKMBINDEX, TROPONINI in the last 168 hours.  BNP (last 3 results) No results for input(s): PROBNP in the last 8760 hours.  HbA1C: No results for input(s): HGBA1C in the last 72 hours.  CBG:  Recent Labs Lab 11/19/16 2058  GLUCAP 162*    Lipid Profile: No results for input(s): CHOL, HDL, LDLCALC, TRIG, CHOLHDL, LDLDIRECT in the last 72 hours.  Thyroid Function Tests: No results for input(s): TSH, T4TOTAL, FREET4, T3FREE, THYROIDAB in the last 72 hours.  Anemia Panel: No results for input(s): VITAMINB12, FOLATE, FERRITIN, TIBC, IRON, RETICCTPCT in the last 72 hours.  Urine analysis: No results found for: COLORURINE, APPEARANCEUR,  LABSPEC, PHURINE, GLUCOSEU, HGBUR, BILIRUBINUR, KETONESUR, PROTEINUR, UROBILINOGEN, NITRITE, LEUKOCYTESUR  Sepsis Labs: Lactic Acid, Venous    Component Value Date/Time   LATICACIDVEN 1.38 11/19/2016 1550    MICROBIOLOGY: No results found for this or any previous visit (from the past 240 hour(s)).  RADIOLOGY STUDIES/RESULTS: No results found.   LOS: 1 day   Rose Clousing, PAS  [employer]  If 7PM-7AM, please contact night-coverage [email or web address] Password [email or web address] 11/20/2016, 10:33 AM  Attending MD note  Patient was seen, examined,treatment plan was discussed with the PA-S.  I have personally reviewed the clinical findings, lab, imaging studies and management of this patient in detail. I agree with the documentation, as recorded by the PA-S.   Per patient, his right lower extremity is less "angry". Spouse claims it is less purple better yesterday.  On Exam: Gen. exam: Awake, alert, not in any distress Chest: Good air entry bilaterally, no rhonchi or rales CVS: S1-S2 regular, no murmurs Abdomen: Soft, nontender and nondistended Neurology: Non-focal Skin:  See above picture   Impression Right lower extremity cellulitis Transaminitis  Plan Continue IV clindamycin Follow clinical course-daily right leg exam Follow LFT's  Rest as above  [facility]

## 2016-11-20 NOTE — Progress Notes (Signed)
Patient received from ED via stretcher. Alert and oriented x4.  Vital signs stable.Oriented to room and call bell.Denies pain at this time. Wife at bedside.

## 2016-11-21 ENCOUNTER — Inpatient Hospital Stay (HOSPITAL_COMMUNITY): Payer: BC Managed Care – PPO

## 2016-11-21 ENCOUNTER — Encounter: Payer: BC Managed Care – PPO | Admitting: Family Medicine

## 2016-11-21 DIAGNOSIS — R609 Edema, unspecified: Secondary | ICD-10-CM

## 2016-11-21 LAB — CBC
HCT: 36.1 % — ABNORMAL LOW (ref 39.0–52.0)
Hemoglobin: 11.8 g/dL — ABNORMAL LOW (ref 13.0–17.0)
MCH: 28.2 pg (ref 26.0–34.0)
MCHC: 32.7 g/dL (ref 30.0–36.0)
MCV: 86.2 fL (ref 78.0–100.0)
Platelets: 391 10*3/uL (ref 150–400)
RBC: 4.19 MIL/uL — ABNORMAL LOW (ref 4.22–5.81)
RDW: 13.6 % (ref 11.5–15.5)
WBC: 11.7 10*3/uL — ABNORMAL HIGH (ref 4.0–10.5)

## 2016-11-21 LAB — COMPREHENSIVE METABOLIC PANEL
ALBUMIN: 2.3 g/dL — AB (ref 3.5–5.0)
ALT: 222 U/L — ABNORMAL HIGH (ref 17–63)
AST: 171 U/L — ABNORMAL HIGH (ref 15–41)
Alkaline Phosphatase: 201 U/L — ABNORMAL HIGH (ref 38–126)
Anion gap: 10 (ref 5–15)
BILIRUBIN TOTAL: 1.3 mg/dL — AB (ref 0.3–1.2)
BUN: 16 mg/dL (ref 6–20)
CO2: 23 mmol/L (ref 22–32)
Calcium: 8.7 mg/dL — ABNORMAL LOW (ref 8.9–10.3)
Chloride: 102 mmol/L (ref 101–111)
Creatinine, Ser: 0.92 mg/dL (ref 0.61–1.24)
GFR calc Af Amer: >60 mL/min (ref >60)
GFR calc non Af Amer: >60 mL/min (ref >60)
GLUCOSE: 108 mg/dL — AB (ref 65–99)
POTASSIUM: 4.1 mmol/L (ref 3.5–5.1)
Sodium: 135 mmol/L (ref 135–145)
TOTAL PROTEIN: 6.7 g/dL (ref 6.5–8.1)

## 2016-11-21 LAB — GLUCOSE, CAPILLARY
GLUCOSE-CAPILLARY: 101 mg/dL — AB (ref 65–99)
GLUCOSE-CAPILLARY: 128 mg/dL — AB (ref 65–99)
Glucose-Capillary: 127 mg/dL — ABNORMAL HIGH (ref 65–99)

## 2016-11-21 MED ORDER — VANCOMYCIN HCL 10 G IV SOLR
1250.0000 mg | Freq: Two times a day (BID) | INTRAVENOUS | Status: DC
  Administered 2016-11-21 – 2016-11-24 (×6): 1250 mg via INTRAVENOUS
  Filled 2016-11-21 (×7): qty 1250

## 2016-11-21 MED ORDER — VANCOMYCIN HCL 10 G IV SOLR
2000.0000 mg | Freq: Once | INTRAVENOUS | Status: AC
  Administered 2016-11-21: 2000 mg via INTRAVENOUS
  Filled 2016-11-21: qty 2000

## 2016-11-21 NOTE — Progress Notes (Signed)
PROGRESS NOTE        PATIENT DETAILS Name: Dean Aguirre Age: 56 y.o. Sex: male Date of Birth: [birth year] Admit Date: 11/19/2016 Admitting Physician Ozella Rocks, MD ZOX:WRUEAVW,UJWJXB H, MD  Brief Narrative: Patient is a 56 y.o. male who was referred to the hospital by his PCP on 11/19/16 for right lower leg cellulitis that is progressively worsening. He took oral cefdinir and rocephin IM in office of PCP on 1/26, but symptoms worsened. Leg is significant for edema, erythema and tenderness to palpation. No significant PMH noted. Patient remains afebrile.    Subjective: Patient reports to be feeling better and is a pleasant mood, sitting upright in bed. He claims that his swelling has improved since yesterday and that his pain is well controlled. He denies any abdominal pain that may be related to elevated LFTs.   Assessment/Plan: Cellulitis of RLE: Erythema is improving with leukocytosis trending down. He remains afebrile. Outpatient treatment of rocephin IM and oral cefdinir failed.Although slightly improved-continues to have erythema-will stop clindamycin and add IV vancomycin. Patient instructed to continue to elevate left leg. Both cultures negative so far. Obtain right lower extremity Doppler to make sure no DVT which I doubt. Continue to follow clinical course.  Transaminitis: LFTs continue to trend upward. Acute hepatitis panel negative. RUQ ultrasound scheduled for today.   Hyperglycemia: No h/o Dm. CBG is 127 today. Continue to monitor CBGs, awaiting A1C result.     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 Lifestyle changes encouraged before the onset of chronic diseases.   DVT Prophylaxis: Prophylactic Lovenox   Code Status: Full code.  Family Communication: None at bedside.  Disposition Plan: Remain inpatient-but will plan on going home in next 2-3 days upon discharge.  Antimicrobial agents: Anti-infectives    Start     Dose/Rate Route Frequency  Ordered Stop   11/19/16 1730  clindamycin (CLEOCIN) IVPB 600 mg     600 mg 100 mL/hr over 30 Minutes Intravenous Every 8 hours 11/19/16 1723     11/19/16 1315  vancomycin (VANCOCIN) IVPB 1000 mg/200 mL premix     1,000 mg 200 mL/hr over 60 Minutes Intravenous  Once 11/19/16 1307 11/19/16 1433      Procedures: RUQ ultrasound scheduled today.  CONSULTS: None  Time spent: 25 minutes-Greater than 50% of this time was spent in counseling, explanation of diagnosis, planning of further management, and coordination of care.  MEDICATIONS: Scheduled Meds: . clindamycin (CLEOCIN) IV  600 mg Intravenous Q8H  . enoxaparin (LOVENOX) injection  40 mg Subcutaneous Q24H  . mupirocin ointment   Topical BID  . nystatin   Topical BID   Continuous Infusions: PRN Meds:.acetaminophen **OR** acetaminophen, fentaNYL (SUBLIMAZE) injection, ketorolac, ondansetron **OR** ondansetron (ZOFRAN) IV   PHYSICAL EXAM: Vital signs: Vitals:   11/20/16 0437 11/20/16 1945 11/20/16 2041 11/21/16 0518  BP: 119/75 (!) 137/94 (!) 145/83 (!) 141/93  Pulse: 86 84 84 81  Resp: 19 18 17 18   Temp: 99.1 F (37.3 C) 99.1 F (37.3 C) 98.4 F (36.9 C) 99.1 F (37.3 C)  TempSrc: Oral Oral Oral Oral  SpO2: 97% 98% 98% 93%  Weight:      Height:       Filed Weights   11/19/16 1205  Weight: 98.6 kg (217 lb 6 oz)   Body mass index is 34.05 kg/m.   General appearance :Awake, alert, not  in any distress. Speech Clear. Not toxic Looking Eyes: No scleral icterus.Pink conjunctiva HEENT: Atraumatic and Normocephalic Resp:Good air entry bilaterally, no added sounds  CVS: S1 S2 regular, no murmurs.  GI: Bowel sounds present, Non tender and not distended with no gaurding, rigidity or rebound.No organomegaly Extremities: RLE is erythematous, edematous and warm to touch. No area of induration, ulceration seen. See pictures below. Neurology:  speech clear,Non focal, sensation is grossly intact. Psychiatric: Normal judgment  and insight. Alert and oriented x 3. Normal mood. Skin: No rash, warm and dry. RLE picture shown below.   Note picture take on 1/31 after patient's consent   I have personally reviewed following labs and imaging studies  LABORATORY DATA: CBC:  Recent Labs Lab 11/19/16 1218 11/20/16 0530 11/21/16 0505  WBC 13.0* 12.5* 11.7*  NEUTROABS 10.3*  --   --   HGB 13.5 12.0* 11.8*  HCT 40.6 36.8* 36.1*  MCV 85.7 86.6 86.2  PLT 360 357 391    Basic Metabolic Panel:  Recent Labs Lab 11/19/16 1218 11/20/16 0530 11/21/16 0505  NA 134* 138 135  K 3.8 4.0 4.1  CL 98* 104 102  CO2 26 25 23   GLUCOSE 116* 112* 108*  BUN 14 14 16   CREATININE 0.98 0.98 0.92  CALCIUM 9.1 8.7* 8.7*    GFR: Estimated Creatinine Clearance: 103.9 mL/min (by C-G formula based on SCr of 0.92 mg/dL).  Liver Function Tests:  Recent Labs Lab 11/19/16 1218 11/20/16 0530 11/21/16 0505  AST 75* 114* 171*  ALT 98* 139* 222*  ALKPHOS 168* 174* 201*  BILITOT 1.8* 1.8* 1.3*  PROT 7.6 6.5 6.7  ALBUMIN 2.8* 2.3* 2.3*   No results for input(s): LIPASE, AMYLASE in the last 168 hours. No results for input(s): AMMONIA in the last 168 hours.  Coagulation Profile: No results for input(s): INR, PROTIME in the last 168 hours.  Cardiac Enzymes: No results for input(s): CKTOTAL, CKMB, CKMBINDEX, TROPONINI in the last 168 hours.  BNP (last 3 results) No results for input(s): PROBNP in the last 8760 hours.  HbA1C: No results for input(s): HGBA1C in the last 72 hours.  CBG:  Recent Labs Lab 11/19/16 2058 11/20/16 1824 11/21/16 0806  GLUCAP 162* 147* 127*    Lipid Profile: No results for input(s): CHOL, HDL, LDLCALC, TRIG, CHOLHDL, LDLDIRECT in the last 72 hours.  Thyroid Function Tests: No results for input(s): TSH, T4TOTAL, FREET4, T3FREE, THYROIDAB in the last 72 hours.  Anemia Panel: No results for input(s): VITAMINB12, FOLATE, FERRITIN, TIBC, IRON, RETICCTPCT in the last 72 hours.  Urine  analysis: No results found for: COLORURINE, APPEARANCEUR, LABSPEC, PHURINE, GLUCOSEU, HGBUR, BILIRUBINUR, KETONESUR, PROTEINUR, UROBILINOGEN, NITRITE, LEUKOCYTESUR  Sepsis Labs: Lactic Acid, Venous    Component Value Date/Time   LATICACIDVEN 1.38 11/19/2016 1550    MICROBIOLOGY: Recent Results (from the past 240 hour(s))  Culture, blood (routine x 2)     Status: None (Preliminary result)   Collection Time: 11/19/16  6:06 PM  Result Value Ref Range Status   Specimen Description BLOOD LEFT ANTECUBITAL  Final   Special Requests BOTTLES DRAWN AEROBIC AND ANAEROBIC 5CC  Final   Culture NO GROWTH < 24 HOURS  Final   Report Status PENDING  Incomplete  Culture, blood (routine x 2)     Status: None (Preliminary result)   Collection Time: 11/19/16  9:30 PM  Result Value Ref Range Status   Specimen Description BLOOD RIGHT ANTECUBITAL  Final   Special Requests BOTTLES DRAWN AEROBIC AND ANAEROBIC 5CC  Final   Culture NO GROWTH < 24 HOURS  Final   Report Status PENDING  Incomplete    RADIOLOGY STUDIES/RESULTS: No results found.   LOS: 2 days   Joanie Coddington, PA-S [facility]  11/21/2016  If 7PM-7AM, please contact night-coverage [email or web address] Password [facility] 11/21/2016, 10:39 AM  Attending MD note  Patient was seen, examined,treatment plan was discussed with the PA-S.  I have personally reviewed the clinical findings, lab, imaging studies and management of this patient in detail. I agree with the documentation, as recorded by the PA-S.   Patient erythema in his right leg is slightly better,. Remains afebrile overnight.  On Exam: Gen. exam: Awake, alert, not in any distress Chest: Good air entry bilaterally, no rhonchi or rales CVS: S1-S2 regular, no murmurs Abdomen: Soft, nontender and nondistended Neurology: Non-focal Skin: No rash or lesions  Labs: WBC 11.7 Creatinine 0.92 AST 171 ALT 222  Assessment and plan: Right leg cellulitis: Only minimal improvement-stopped  Clinda-start IV vancomycin. Continue to follow cultures. Although I doubt DVT-we'll obtain Doppler to make sure no DVT. Transaminitis: Not sure why this is still worsening-acute hepatitis serology is negative, obtaining RUQ ultrasound. Continue to monitor.  Rest as above  [facility]

## 2016-11-21 NOTE — Progress Notes (Signed)
Pharmacy Antibiotic Note  Dean Aguirre is a 56 y.o. male admitted on 11/19/2016 with cellulitis.  Pharmacy has been consulted for vancomycin dosing. Patient previously received 1x dose of vancomycin on 1/29, transitioned to clindamycin IV - d/c'd this AM. Afebrile, WBC down 11.7, SCr stable wnl, CrCl~100.  Plan: Vancomycin 2g IV x 1 dose; then 1250 IV every 12 hours.  Goal trough 10-15 mcg/mL.  Monitor clinical progress, c/s, renal function, abx plan/LOT Vancomycin trough as indicated   Height: 5\' 7"  (170.2 cm) Weight: 217 lb 6 oz (98.6 kg) IBW/kg (Calculated) : 66.1  Temp (24hrs), Avg:98.9 F (37.2 C), Min:98.4 F (36.9 C), Max:99.1 F (37.3 C)   Recent Labs Lab 11/19/16 1218 11/19/16 1235 11/19/16 1550 11/20/16 0530 11/21/16 0505  WBC 13.0*  --   --  12.5* 11.7*  CREATININE 0.98  --   --  0.98 0.92  LATICACIDVEN  --  0.97 1.38  --   --     Estimated Creatinine Clearance: 103.9 mL/min (by C-G formula based on SCr of 0.92 mg/dL).    No Known Allergies  Dean Aguirre, PharmD, BCPS Clinical Pharmacist 11/21/2016 11:17 AM

## 2016-11-21 NOTE — Progress Notes (Signed)
*  PRELIMINARY RESULTS* Vascular Ultrasound Bilateral lower extremity venous duplex has been completed.  Preliminary findings: No evidence of deep vein thrombosis or baker's cysts in the right lower extremity.   Chauncey FischerCharlotte C Treanna Dumler 11/21/2016, 5:03 PM

## 2016-11-22 DIAGNOSIS — E785 Hyperlipidemia, unspecified: Secondary | ICD-10-CM

## 2016-11-22 HISTORY — DX: Hyperlipidemia, unspecified: E78.5

## 2016-11-22 LAB — HEPATIC FUNCTION PANEL
ALK PHOS: 175 U/L — AB (ref 38–126)
ALT: 202 U/L — ABNORMAL HIGH (ref 17–63)
AST: 135 U/L — ABNORMAL HIGH (ref 15–41)
Albumin: 2.4 g/dL — ABNORMAL LOW (ref 3.5–5.0)
Bilirubin, Direct: 0.4 mg/dL (ref 0.1–0.5)
Indirect Bilirubin: 0.9 mg/dL (ref 0.3–0.9)
TOTAL PROTEIN: 7 g/dL (ref 6.5–8.1)
Total Bilirubin: 1.3 mg/dL — ABNORMAL HIGH (ref 0.3–1.2)

## 2016-11-22 LAB — HEMOGLOBIN A1C
HEMOGLOBIN A1C: 5.8 % — AB (ref 4.8–5.6)
MEAN PLASMA GLUCOSE: 120 mg/dL

## 2016-11-22 LAB — GLUCOSE, CAPILLARY
GLUCOSE-CAPILLARY: 120 mg/dL — AB (ref 65–99)
GLUCOSE-CAPILLARY: 132 mg/dL — AB (ref 65–99)
GLUCOSE-CAPILLARY: 100 mg/dL — AB (ref 65–99)
Glucose-Capillary: 110 mg/dL — ABNORMAL HIGH (ref 65–99)

## 2016-11-22 NOTE — Progress Notes (Signed)
PROGRESS NOTE        PATIENT DETAILS Name: Dean Aguirre Age: 56 y.o. Sex: male Date of Birth: 08-23-1961 Admit Date: 11/19/2016 Admitting Physician Ozella Rocks, MD ZOX:WRUEAVW,UJWJXB H, MD  Brief Narrative: Patient is a 56 y.o. male who was referred to the hospital by his PCP on 11/19/16 for right lower leg cellulitis that is progressively worsening. He took oral cefdinir and rocephin IM in office of PCP on 1/26, but symptoms worsened. Leg is significant for edema, erythema and tenderness to palpation. No significant PMH noted. Patient remains afebrile.    Subjective: Right leg erythema and swelling has improved.  Assessment/Plan: Cellulitis of RLE: Erythema is much improved today, swelling is better today. Continue IV Vancomycin. Blood cultures negative so far. Right lower ext doppler negative. Suspect that if improvement continues, will discharge home on 2/2  Transaminitis: LFTs now downtrending. Acute hepatitis panel negative. . Etiology could be from recent Abx use. RUQ ultrasound suggestive of fatty infiltration.  Hyperglycemia: No h/o Dm. A1C 5.8     Obesity class I: BMI 34.1. Lifestyle changes encouraged before the onset of chronic diseases.   DVT Prophylaxis: Prophylactic Lovenox   Code Status: Full code.  Family Communication: None at bedside.  Disposition Plan: Remain inpatient-but will plan on going home in next 2-3 days upon discharge.  Antimicrobial agents: Anti-infectives    Start     Dose/Rate Route Frequency Ordered Stop   11/22/16 0000  vancomycin (VANCOCIN) 1,250 mg in sodium chloride 0.9 % 250 mL IVPB     1,250 mg 166.7 mL/hr over 90 Minutes Intravenous Every 12 hours 11/21/16 1121     11/21/16 1130  vancomycin (VANCOCIN) 2,000 mg in sodium chloride 0.9 % 500 mL IVPB     2,000 mg 250 mL/hr over 120 Minutes Intravenous  Once 11/21/16 1121 11/21/16 1446   11/19/16 1730  clindamycin (CLEOCIN) IVPB 600 mg  Status:   Discontinued     600 mg 100 mL/hr over 30 Minutes Intravenous Every 8 hours 11/19/16 1723 11/21/16 1102   11/19/16 1315  vancomycin (VANCOCIN) IVPB 1000 mg/200 mL premix     1,000 mg 200 mL/hr over 60 Minutes Intravenous  Once 11/19/16 1307 11/19/16 1433      Procedures: None  CONSULTS: None  Time spent: 25 minutes-Greater than 50% of this time was spent in counseling, explanation of diagnosis, planning of further management, and coordination of care.  MEDICATIONS: Scheduled Meds: . enoxaparin (LOVENOX) injection  40 mg Subcutaneous Q24H  . nystatin   Topical BID  . vancomycin  1,250 mg Intravenous Q12H   Continuous Infusions: PRN Meds:.acetaminophen **OR** acetaminophen, fentaNYL (SUBLIMAZE) injection, ketorolac, ondansetron **OR** ondansetron (ZOFRAN) IV   PHYSICAL EXAM: Vital signs: Vitals:   11/21/16 2119 11/22/16 0556 11/22/16 1007 11/22/16 1405  BP: (!) 143/87 136/85 (!) 147/90 (!) 153/96  Pulse: 72 85 77 79  Resp: 17 16 18 20   Temp: 98.5 F (36.9 C) 99.4 F (37.4 C) 98.4 F (36.9 C) 99 F (37.2 C)  TempSrc: Oral Oral Oral Oral  SpO2: 96% 96% 94% 96%  Weight:      Height:       Filed Weights   11/19/16 1205  Weight: 98.6 kg (217 lb 6 oz)   Body mass index is 34.05 kg/m.   General appearance :Awake, alert, not in any distress. Speech Clear. Not toxic  Looking Eyes: No scleral icterus.Pink conjunctiva HEENT: Atraumatic and Normocephalic Resp:Good air entry bilaterally, no added sounds  CVS: S1 S2 regular, no murmurs.  GI: Bowel sounds present, Non tender and not distended with no gaurding, rigidity or rebound.No organomegaly Extremities: RLE is erythematous but decreased. Leg not tense Neurology:  speech clear,Non focal, sensation is grossly intact. Psychiatric: Normal judgment and insight. Alert and oriented x 3. Normal mood. Skin: No rash, warm and dry.  I have personally reviewed following labs and imaging studies  LABORATORY  DATA: CBC:  Recent Labs Lab 11/19/16 1218 11/20/16 0530 11/21/16 0505  WBC 13.0* 12.5* 11.7*  NEUTROABS 10.3*  --   --   HGB 13.5 12.0* 11.8*  HCT 40.6 36.8* 36.1*  MCV 85.7 86.6 86.2  PLT 360 357 391    Basic Metabolic Panel:  Recent Labs Lab 11/19/16 1218 11/20/16 0530 11/21/16 0505  NA 134* 138 135  K 3.8 4.0 4.1  CL 98* 104 102  CO2 26 25 23   GLUCOSE 116* 112* 108*  BUN 14 14 16   CREATININE 0.98 0.98 0.92  CALCIUM 9.1 8.7* 8.7*    GFR: Estimated Creatinine Clearance: 103.9 mL/min (by C-G formula based on SCr of 0.92 mg/dL).  Liver Function Tests:  Recent Labs Lab 11/19/16 1218 11/20/16 0530 11/21/16 0505 11/22/16 0614  AST 75* 114* 171* 135*  ALT 98* 139* 222* 202*  ALKPHOS 168* 174* 201* 175*  BILITOT 1.8* 1.8* 1.3* 1.3*  PROT 7.6 6.5 6.7 7.0  ALBUMIN 2.8* 2.3* 2.3* 2.4*   No results for input(s): LIPASE, AMYLASE in the last 168 hours. No results for input(s): AMMONIA in the last 168 hours.  Coagulation Profile: No results for input(s): INR, PROTIME in the last 168 hours.  Cardiac Enzymes: No results for input(s): CKTOTAL, CKMB, CKMBINDEX, TROPONINI in the last 168 hours.  BNP (last 3 results) No results for input(s): PROBNP in the last 8760 hours.  HbA1C:  Recent Labs  11/21/16 0506  HGBA1C 5.8*    CBG:  Recent Labs Lab 11/21/16 0806 11/21/16 1211 11/21/16 2117 11/22/16 0739 11/22/16 1131  GLUCAP 127* 101* 128* 120* 110*    Lipid Profile: No results for input(s): CHOL, HDL, LDLCALC, TRIG, CHOLHDL, LDLDIRECT in the last 72 hours.  Thyroid Function Tests: No results for input(s): TSH, T4TOTAL, FREET4, T3FREE, THYROIDAB in the last 72 hours.  Anemia Panel: No results for input(s): VITAMINB12, FOLATE, FERRITIN, TIBC, IRON, RETICCTPCT in the last 72 hours.  Urine analysis: No results found for: COLORURINE, APPEARANCEUR, LABSPEC, PHURINE, GLUCOSEU, HGBUR, BILIRUBINUR, KETONESUR, PROTEINUR, UROBILINOGEN, NITRITE,  LEUKOCYTESUR  Sepsis Labs: Lactic Acid, Venous    Component Value Date/Time   LATICACIDVEN 1.38 11/19/2016 1550    MICROBIOLOGY: Recent Results (from the past 240 hour(s))  Culture, blood (routine x 2)     Status: None (Preliminary result)   Collection Time: 11/19/16  6:06 PM  Result Value Ref Range Status   Specimen Description BLOOD LEFT ANTECUBITAL  Final   Special Requests BOTTLES DRAWN AEROBIC AND ANAEROBIC 5CC  Final   Culture NO GROWTH 2 DAYS  Final   Report Status PENDING  Incomplete  Culture, blood (routine x 2)     Status: None (Preliminary result)   Collection Time: 11/19/16  9:30 PM  Result Value Ref Range Status   Specimen Description BLOOD RIGHT ANTECUBITAL  Final   Special Requests BOTTLES DRAWN AEROBIC AND ANAEROBIC 5CC  Final   Culture NO GROWTH 2 DAYS  Final   Report Status PENDING  Incomplete    RADIOLOGY STUDIES/RESULTS: US Abdomen Limited Ruq  Result Date: 11/21/2016 CLINICAL DATA:  Elevated transaminases. EXAM: US ABDOMEN LIMITED - RIGHT UPPER QUADRANT COMPARISON:  No recent prior . FINDINGS: Gallbladder: No gallstones or wall thickening visualized. No sonographic Murphy sign noted by sonographer. Common bile duct: Diameter: 4.6 mm Liver: There is echogenic consistent fatty infiltration and/or hepatocellular disease. No focal hepatic abnormality identified . IMPRESSION: The liver is echogenic consistent with fatty infiltration and/or hepatocellular disease. No acute or focal abnormality identified. No gallstones or biliary distention. Electronically Signed   By: Maisie Fus  Register   On: 11/21/2016 16:35    [facility]   If 7PM-7AM, please contact night-coverage [email or web address] Password [email or web address] 11/22/2016, 3:14 PM

## 2016-11-23 ENCOUNTER — Encounter: Payer: Self-pay | Admitting: Family Medicine

## 2016-11-23 LAB — COMPREHENSIVE METABOLIC PANEL
ALT: 215 U/L — ABNORMAL HIGH (ref 17–63)
AST: 148 U/L — ABNORMAL HIGH (ref 15–41)
Albumin: 2.7 g/dL — ABNORMAL LOW (ref 3.5–5.0)
Alkaline Phosphatase: 190 U/L — ABNORMAL HIGH (ref 38–126)
Anion gap: 8 (ref 5–15)
BUN: 12 mg/dL (ref 6–20)
CHLORIDE: 102 mmol/L (ref 101–111)
CO2: 25 mmol/L (ref 22–32)
CREATININE: 0.97 mg/dL (ref 0.61–1.24)
Calcium: 9.1 mg/dL (ref 8.9–10.3)
Glucose, Bld: 114 mg/dL — ABNORMAL HIGH (ref 65–99)
POTASSIUM: 4.3 mmol/L (ref 3.5–5.1)
Sodium: 135 mmol/L (ref 135–145)
Total Bilirubin: 1.3 mg/dL — ABNORMAL HIGH (ref 0.3–1.2)
Total Protein: 7.4 g/dL (ref 6.5–8.1)

## 2016-11-23 LAB — GLUCOSE, CAPILLARY
GLUCOSE-CAPILLARY: 125 mg/dL — AB (ref 65–99)
GLUCOSE-CAPILLARY: 108 mg/dL — AB (ref 65–99)
Glucose-Capillary: 132 mg/dL — ABNORMAL HIGH (ref 65–99)
Glucose-Capillary: 118 mg/dL — ABNORMAL HIGH (ref 65–99)

## 2016-11-23 MED ORDER — POTASSIUM CHLORIDE CRYS ER 20 MEQ PO TBCR
20.0000 meq | EXTENDED_RELEASE_TABLET | Freq: Once | ORAL | Status: AC
  Administered 2016-11-23: 20 meq via ORAL
  Filled 2016-11-23: qty 1

## 2016-11-23 MED ORDER — FUROSEMIDE 10 MG/ML IJ SOLN
20.0000 mg | Freq: Once | INTRAMUSCULAR | Status: AC
  Administered 2016-11-23: 20 mg via INTRAVENOUS
  Filled 2016-11-23: qty 2

## 2016-11-23 NOTE — Progress Notes (Signed)
PROGRESS NOTE        PATIENT DETAILS Name: Dean ChimesRichard Aguirre Age: 56 y.o. Sex: male Date of Birth: 1961/05/07 Admit Date: 11/19/2016 Admitting Physician Ozella Rocksavid J Merrell, MD NWG:[id number],QMVHQIPCP:MCGOWEN,PHILIP H, MD  Brief Narrative: Patient is a 56 y.o. male who was referred to the hospital by his PCP on 11/19/16 for progressive right lower leg cellulitis. He took oral cefdinir and rocephin IM in office of PCP on 1/26, but symptoms worsened-he was subsequently admitted to the hospitalist service for further treatment and evaluation. He was empirically started on IV clindamycin, he is clinically improving with decreasing erythema and swelling. Hospital course has been complicated by worsening transaminitis. See below for further details   Subjective: Clinically improving every day-like still appears to be erythematous-but the erythema has tracked down quite a lot. Assessment/Plan: Cellulitis of RLE: Slowly improving, no signs of deep infection or abscess. Blood cultures continue to be negative. Continue empiric vancomycin and elevate extremity. Right lower extremity Doppler was negative. Suspect that if clinical improvement continues that he should be able to be discharged soon. I suspect he will continue to have some amount of erythema and swelling for the next few weeks.     Transaminitis: ? Etiology, could be secondary to recent antibiotic use. LFTs were down trending-but have increased again today. Acute hepatitis panel negative.  RUQ US suggestive of fatty liver. Check LFT with PCP in 2 week.       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 Educated patient on RUQ findings, LFT elevation, see above. Lifestyle changes encouraged before chronic disease onset.   Hyperglycemia: No h/o DM. HgbA1C 5.8.  DVT Prophylaxis: Prophylactic Lovenox  Code Status: Full code  Family Communication: None  Disposition Plan: Remain inpatient - tentative discharge tomorrow.   Antimicrobial  agents: Anti-infectives    Start     Dose/Rate Route Frequency Ordered Stop   11/22/16 0000  vancomycin (VANCOCIN) 1,250 mg in sodium chloride 0.9 % 250 mL IVPB     1,250 mg 166.7 mL/hr over 90 Minutes Intravenous Every 12 hours 11/21/16 1121     11/21/16 1130  vancomycin (VANCOCIN) 2,000 mg in sodium chloride 0.9 % 500 mL IVPB     2,000 mg 250 mL/hr over 120 Minutes Intravenous  Once 11/21/16 1121 11/21/16 1446   11/19/16 1730  clindamycin (CLEOCIN) IVPB 600 mg  Status:  Discontinued     600 mg 100 mL/hr over 30 Minutes Intravenous Every 8 hours 11/19/16 1723 11/21/16 1102   11/19/16 1315  vancomycin (VANCOCIN) IVPB 1000 mg/200 mL premix     1,000 mg 200 mL/hr over 60 Minutes Intravenous  Once 11/19/16 1307 11/19/16 1433      Procedures: None at this time   CONSULTS: None at this time  Time spent: 25 minutes-Greater than 50% of this time was spent in counseling, explanation of diagnosis, planning of further management, and coordination of care.  MEDICATIONS: Scheduled Meds: . enoxaparin (LOVENOX) injection  40 mg Subcutaneous Q24H  . nystatin   Topical BID  . vancomycin  1,250 mg Intravenous Q12H   Continuous Infusions: PRN Meds:.acetaminophen **OR** acetaminophen, fentaNYL (SUBLIMAZE) injection, ketorolac, ondansetron **OR** ondansetron (ZOFRAN) IV   PHYSICAL EXAM: Vital signs: Vitals:   11/22/16 1007 11/22/16 1405 11/22/16 2152 11/23/16 0512  BP: (!) 147/90 (!) 153/96 (!) 152/84 (!) 153/87  Pulse: 77 79 75 80  Resp: 18 20  20 19  Temp: 98.4 F (36.9 C) 99 F (37.2 C) 99.1 F (37.3 C) 99.3 F (37.4 C)  TempSrc: Oral Oral Oral Oral  SpO2: 94% 96% 95% 95%  Weight:      Height:       Filed Weights   11/19/16 1205  Weight: 98.6 kg (217 lb 6 oz)   Body mass index is 34.05 kg/m.   General appearance: Awake, alert, not in any distress. Speech Clear. Not toxic Looking Eyes: PERRLA, no scleral icterus.Pink conjunctiva HEENT: Atraumatic and Normocephalic Neck:  Supple, no JVD. No cervical lymphadenopathy. No thyromegaly Resp:Good air entry bilaterally, no added sounds  CVS: S1 S2 regular, no murmurs.  GI: Bowel sounds present. Obese abdomen. Non tender with no gaurding, rigidity or rebound.No organomegaly Extremities: RLE edematous with erythema. LLE no edema. B/L LE warm to palpation.  Neurology:  Speech clear,Non focal, sensation is grossly intact. Psychiatric: Normal judgment and insight. AAO x 3. Normal mood. Musculoskeletal: No digital cyanosis Skin: No Rash, warm and dry Wounds: N/A  Picture taken on 2/2 after patient's consent   I have personally reviewed following labs and imaging studies  LABORATORY DATA: CBC:  Recent Labs Lab 11/19/16 1218 11/20/16 0530 11/21/16 0505  WBC 13.0* 12.5* 11.7*  NEUTROABS 10.3*  --   --   HGB 13.5 12.0* 11.8*  HCT 40.6 36.8* 36.1*  MCV 85.7 86.6 86.2  PLT 360 357 391    Basic Metabolic Panel:  Recent Labs Lab 11/19/16 1218 11/20/16 0530 11/21/16 0505 11/23/16 0716  NA 134* 138 135 135  K 3.8 4.0 4.1 4.3  CL 98* 104 102 102  CO2 26 25 23 25   GLUCOSE 116* 112* 108* 114*  BUN 14 14 16 12   CREATININE 0.98 0.98 0.92 0.97  CALCIUM 9.1 8.7* 8.7* 9.1    GFR: Estimated Creatinine Clearance: 98.5 mL/min (by C-G formula based on SCr of 0.97 mg/dL).  Liver Function Tests:  Recent Labs Lab 11/19/16 1218 11/20/16 0530 11/21/16 0505 11/22/16 0614 11/23/16 0716  AST 75* 114* 171* 135* 148*  ALT 98* 139* 222* 202* 215*  ALKPHOS 168* 174* 201* 175* 190*  BILITOT 1.8* 1.8* 1.3* 1.3* 1.3*  PROT 7.6 6.5 6.7 7.0 7.4  ALBUMIN 2.8* 2.3* 2.3* 2.4* 2.7*   No results for input(s): LIPASE, AMYLASE in the last 168 hours. No results for input(s): AMMONIA in the last 168 hours.  Coagulation Profile: No results for input(s): INR, PROTIME in the last 168 hours.  Cardiac Enzymes: No results for input(s): CKTOTAL, CKMB, CKMBINDEX, TROPONINI in the last 168 hours.  BNP (last 3 results) No  results for input(s): PROBNP in the last 8760 hours.  HbA1C:  Recent Labs  11/21/16 0506  HGBA1C 5.8*    CBG:  Recent Labs Lab 11/22/16 0739 11/22/16 1131 11/22/16 1710 11/22/16 2158 11/23/16 0738  GLUCAP 120* 110* 132* 100* 125*    Lipid Profile: No results for input(s): CHOL, HDL, LDLCALC, TRIG, CHOLHDL, LDLDIRECT in the last 72 hours.  Thyroid Function Tests: No results for input(s): TSH, T4TOTAL, FREET4, T3FREE, THYROIDAB in the last 72 hours.  Anemia Panel: No results for input(s): VITAMINB12, FOLATE, FERRITIN, TIBC, IRON, RETICCTPCT in the last 72 hours.  Urine analysis: No results found for: COLORURINE, APPEARANCEUR, LABSPEC, PHURINE, GLUCOSEU, HGBUR, BILIRUBINUR, KETONESUR, PROTEINUR, UROBILINOGEN, NITRITE, LEUKOCYTESUR  Sepsis Labs: Lactic Acid, Venous    Component Value Date/Time   LATICACIDVEN 1.38 11/19/2016 1550    MICROBIOLOGY: Recent Results (from the past 240 hour(s))  Culture,  blood (routine x 2)     Status: None (Preliminary result)   Collection Time: 11/19/16  6:06 PM  Result Value Ref Range Status   Specimen Description BLOOD LEFT ANTECUBITAL  Final   Special Requests BOTTLES DRAWN AEROBIC AND ANAEROBIC 5CC  Final   Culture NO GROWTH 3 DAYS  Final   Report Status PENDING  Incomplete  Culture, blood (routine x 2)     Status: None (Preliminary result)   Collection Time: 11/19/16  9:30 PM  Result Value Ref Range Status   Specimen Description BLOOD RIGHT ANTECUBITAL  Final   Special Requests BOTTLES DRAWN AEROBIC AND ANAEROBIC 5CC  Final   Culture NO GROWTH 3 DAYS  Final   Report Status PENDING  Incomplete    RADIOLOGY STUDIES/RESULTS: US Abdomen Limited Ruq  Result Date: 11/21/2016 CLINICAL DATA:  Elevated transaminases. EXAM: US ABDOMEN LIMITED - RIGHT UPPER QUADRANT COMPARISON:  No recent prior . FINDINGS: Gallbladder: No gallstones or wall thickening visualized. No sonographic Murphy sign noted by sonographer. Common bile duct:  Diameter: 4.6 mm Liver: There is echogenic consistent fatty infiltration and/or hepatocellular disease. No focal hepatic abnormality identified . IMPRESSION: The liver is echogenic consistent with fatty infiltration and/or hepatocellular disease. No acute or focal abnormality identified. No gallstones or biliary distention. Electronically Signed   By: Maisie Fus  Register   On: 11/21/2016 16:35     LOS: 4 days   Rose Clousing, PAS  [employer]  If 7PM-7AM, please contact [email or web address] Password [facility] 11/23/2016, 11:43 AM  Attending MD note  Patient was seen, examined,treatment plan was discussed with the PA-S.  I have personally reviewed the clinical findings, lab, imaging studies and management of this patient in detail. I agree with the documentation, as recorded by the PA-S.   Slowly continues to improve  On Exam: Gen. exam: Awake, alert, not in any distress Chest: Good air entry bilaterally, no rhonchi or rales CVS: S1-S2 regular, no murmurs Abdomen: Soft, nontender and nondistended Neurology: Non-focal Skin: See pictures taken above  Assessment and plan: Right lower extremity cellulitis-slowly improving Transaminitis-probably has fatty liver at baseline-worsened by recent antibiotic use  Suspect that if clinical improvement continues home on 2/3  Rest as above  [facility]

## 2016-11-24 LAB — COMPREHENSIVE METABOLIC PANEL
ALBUMIN: 2.6 g/dL — AB (ref 3.5–5.0)
ALT: 173 U/L — ABNORMAL HIGH (ref 17–63)
AST: 97 U/L — AB (ref 15–41)
Alkaline Phosphatase: 165 U/L — ABNORMAL HIGH (ref 38–126)
Anion gap: 10 (ref 5–15)
BUN: 11 mg/dL (ref 6–20)
CHLORIDE: 100 mmol/L — AB (ref 101–111)
CO2: 25 mmol/L (ref 22–32)
Calcium: 9 mg/dL (ref 8.9–10.3)
Creatinine, Ser: 0.97 mg/dL (ref 0.61–1.24)
GFR calc Af Amer: >60 mL/min (ref >60)
GFR calc non Af Amer: >60 mL/min (ref >60)
GLUCOSE: 107 mg/dL — AB (ref 65–99)
Potassium: 4.2 mmol/L (ref 3.5–5.1)
SODIUM: 135 mmol/L (ref 135–145)
Total Bilirubin: 1.2 mg/dL (ref 0.3–1.2)
Total Protein: 7.3 g/dL (ref 6.5–8.1)

## 2016-11-24 LAB — CULTURE, BLOOD (ROUTINE X 2)
CULTURE: NO GROWTH
CULTURE: NO GROWTH

## 2016-11-24 LAB — GLUCOSE, CAPILLARY
Glucose-Capillary: 127 mg/dL — ABNORMAL HIGH (ref 65–99)
Glucose-Capillary: 155 mg/dL — ABNORMAL HIGH (ref 65–99)

## 2016-11-24 MED ORDER — SULFAMETHOXAZOLE-TRIMETHOPRIM 800-160 MG PO TABS: 1.0000 | ORAL_TABLET | Freq: Two times a day (BID) | ORAL | 0 refills | Status: DC

## 2016-11-24 NOTE — Discharge Instructions (Signed)
Follow with Primary MD MCGOWEN,PHILIP H, MD in 5 days   Get CBC, CMP, 2 view Chest X ray checked  by Primary MD in 5 days ( we routinely change or add medications that can affect your baseline labs and fluid status, therefore we recommend that you get the mentioned basic workup next visit with your PCP, your PCP may decide not to get them or add new tests based on their clinical decision)   Activity: As tolerated with Full fall precautions use walker/cane & assistance as needed   Disposition Home     Diet:  Heart Healthy    For Heart failure patients - Check your Weight same time everyday, if you gain over 2 pounds, or you develop in leg swelling, experience more shortness of breath or chest pain, call your Primary MD immediately. Follow Cardiac Low Salt Diet and 1.5 lit/day fluid restriction.   On your next visit with your primary care physician please Get Medicines reviewed and adjusted.   Please request your Prim.MD to go over all Hospital Tests and Procedure/Radiological results at the follow up, please get all Hospital records sent to your Prim MD by signing hospital release before you go home.   If you experience worsening of your admission symptoms, develop shortness of breath, life threatening emergency, suicidal or homicidal thoughts you must seek medical attention immediately by calling 911 or calling your MD immediately  if symptoms less severe.  You Must read complete instructions/literature along with all the possible adverse reactions/side effects for all the Medicines you take and that have been prescribed to you. Take any new Medicines after you have completely understood and accpet all the possible adverse reactions/side effects.   Do not drive, operate heavy machinery, perform activities at heights, swimming or participation in water activities or provide baby sitting services if your were admitted for syncope or siezures until you have seen by Primary MD or a Neurologist  and advised to do so again.  Do not drive when taking Pain medications.    Do not take more than prescribed Pain, Sleep and Anxiety Medications  Special Instructions: If you have smoked or chewed Tobacco  in the last 2 yrs please stop smoking, stop any regular Alcohol  and or any Recreational drug use.  Wear Seat belts while driving.   Please note  You were cared for by a hospitalist during your hospital stay. If you have any questions about your discharge medications or the care you received while you were in the hospital after you are discharged, you can call the unit and asked to speak with the hospitalist on call if the hospitalist that took care of you is not available. Once you are discharged, your primary care physician will handle any further medical issues. Please note that NO REFILLS for any discharge medications will be authorized once you are discharged, as it is imperative that you return to your primary care physician (or establish a relationship with a primary care physician if you do not have one) for your aftercare needs so that they can reassess your need for medications and monitor your lab values.

## 2016-11-24 NOTE — Discharge Summary (Signed)
Dean Aguirre WJX:[id number] DOB: 1961-08-14 DOA: 11/19/2016  PCP: Jeoffrey Massed, MD  Admit date: 11/19/2016  Discharge date: 11/24/2016  Admitted From: Home   Disposition:  Home   Recommendations for Outpatient Follow-up:   Follow up with PCP in 1-2 weeks  PCP Please obtain BMP/CBC, 2 view CXR in 1week,  (see Discharge instructions)   PCP Please follow up on the following pending results: Follow CMP closely   Home Health: None   Equipment/Devices: None  Consultations: None Discharge Condition: Stable   CODE STATUS: Full   Diet Recommendation:  Heart Healthy    Chief Complaint  Patient presents with  . Cellulitis     Brief history of present illness from the day of admission and additional interim summary    Patient is a 56 y.o. male who was referred to the hospital by his PCP on 11/19/16 for progressive right lower leg cellulitis. He took oral cefdinir and rocephin IM in office of PCP on 1/26, but symptoms worsened-he was subsequently admitted to the hospitalist service for further treatment and evaluation. He was empirically started on IV clindamycin, he is clinically improving with decreasing erythema and swelling. Hospital course has been complicated by worsening transaminitis. See below for further details                                                                  Hospital Course   Cellulitis of RLE: He received IV vancomycin 3 doses with excellent improvement. Right lower extremity Doppler was negative. The transition him to oral Bactrim for 1 week follow with PCP in a week, he is afebrile, nontoxic, redness has come down from mid thigh to below knee, minimal warmth, we will apply TED stockings and request patient to take at home with him, has been advised to keep his leg elevated.      Transaminitis: Transient and trending down likely antibiotic related. LFTs were down trending-but have increased again today. Acute hepatitis panel negative.  RUQ [place] suggestive of fatty liver. Check LFT with PCP in 1-2 week.   quest PCP to monitor LFTs and fatty liver.     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 Educated patient on RUQ findings, LFT elevation, see above. Lifestyle changes encouraged before chronic disease onset. Follow with PCP for the same.  Hyperglycemia: Upon admission, likely stress related, No h/o DM. HgbA1C 5.8.     Discharge diagnosis     Active Problems:   Cellulitis   Transaminitis   Hyperglycemia   Foot infection    Discharge instructions      Discharge Medications   Allergies as of 11/24/2016   No Known Allergies     Medication List    STOP taking these medications   cefdinir 300 MG capsule Commonly known as:  OMNICEF  TAKE these medications   HYDROcodone-acetaminophen 5-325 MG tablet Commonly known as:  NORCO/VICODIN Take 1-2 tablets by mouth every 6 (six) hours as needed for moderate pain.   sulfamethoxazole-trimethoprim 800-160 MG tablet Commonly known as:  BACTRIM DS,SEPTRA DS Take 1 tablet by mouth 2 (two) times daily.       Follow-up Information    MCGOWEN,PHILIP H, MD. Schedule an appointment as soon as possible for a visit in 5 day(s).   Specialty:  Family Medicine Contact information: [address] [place] [address] [phone]           Major procedures and Radiology Reports - PLEASE review detailed and final reports thoroughly  -       [place] Abdomen Limited Ruq  Result Date: 11/21/2016 CLINICAL DATA:  Elevated transaminases. EXAM: US ABDOMEN LIMITED - RIGHT UPPER QUADRANT COMPARISON:  No recent prior . FINDINGS: Gallbladder: No gallstones or wall thickening visualized. No sonographic Murphy sign noted by sonographer. Common bile duct: Diameter: 4.6 mm Liver: There is echogenic consistent fatty infiltration and/or  hepatocellular disease. No focal hepatic abnormality identified . IMPRESSION: The liver is echogenic consistent with fatty infiltration and/or hepatocellular disease. No acute or focal abnormality identified. No gallstones or biliary distention. Electronically Signed   By: Maisie Fushomas  Register   On: 11/21/2016 16:35    Micro Results     Recent Results (from the past 240 hour(s))  Culture, blood (routine x 2)     Status: None (Preliminary result)   Collection Time: 11/19/16  6:06 PM  Result Value Ref Range Status   Specimen Description BLOOD LEFT ANTECUBITAL  Final   Special Requests BOTTLES DRAWN AEROBIC AND ANAEROBIC 5CC  Final   Culture NO GROWTH 4 DAYS  Final   Report Status PENDING  Incomplete  Culture, blood (routine x 2)     Status: None (Preliminary result)   Collection Time: 11/19/16  9:30 PM  Result Value Ref Range Status   Specimen Description BLOOD RIGHT ANTECUBITAL  Final   Special Requests BOTTLES DRAWN AEROBIC AND ANAEROBIC 5CC  Final   Culture NO GROWTH 4 DAYS  Final   Report Status PENDING  Incomplete    Today   Subjective    Dean Aguirre today has no headache,no chest abdominal pain,no new weakness tingling or numbness, feels much better wants to go home today.     Objective   Blood pressure 132/83, pulse 78, temperature 98.7 F (37.1 C), temperature source Oral, resp. rate 18, height 5\' 7"  (1.702 m), weight 98.6 kg (217 lb 6 oz), SpO2 93 %.   Intake/Output Summary (Last 24 hours) at 11/24/16 1000 Last data filed at 11/23/16 1449  Gross per 24 hour  Intake              240 ml  Output                0 ml  Net              240 ml    Exam Awake Alert, Oriented x 3, No new F.N deficits, Normal affect [place].AT,PERRAL Supple Neck,No JVD, No cervical lymphadenopathy appriciated.  Symmetrical Chest wall movement, Good air movement bilaterally, CTAB RRR,No Gallops,Rubs or new Murmurs, No Parasternal Heave +ve B.Sounds, Abd Soft, Non tender, No organomegaly  appriciated, No rebound -guarding or rigidity. No Cyanosis, Clubbing or edema, No new Rash or bruise, R leg cellulitis now well below the knee, minimal warmth, still mildly red   Data Review  CBC w Diff: Lab Results  Component Value Date   WBC 11.7 (H) 11/21/2016   HGB 11.8 (L) 11/21/2016   HCT 36.1 (L) 11/21/2016   PLT 391 11/21/2016   LYMPHOPCT 12 11/19/2016   MONOPCT 7 11/19/2016   EOSPCT 1 11/19/2016   BASOPCT 1 11/19/2016    CMP: Lab Results  Component Value Date   NA 135 11/24/2016   K 4.2 11/24/2016   CL 100 (L) 11/24/2016   CO2 25 11/24/2016   BUN 11 11/24/2016   CREATININE 0.97 11/24/2016   PROT 7.3 11/24/2016   ALBUMIN 2.6 (L) 11/24/2016   BILITOT 1.2 11/24/2016   ALKPHOS 165 (H) 11/24/2016   AST 97 (H) 11/24/2016   ALT 173 (H) 11/24/2016  .   Total Time in preparing paper work, data evaluation and todays exam - 35 minutes  Leroy Sea M.D on 11/24/2016 at 10:00 AM  [facility]   Office  [phone]

## 2016-11-24 NOTE — Progress Notes (Signed)
Pt discharged home in stable condition after going over discharge instructions with no concerns voiced. AVS provided before discharge

## 2016-11-28 ENCOUNTER — Telehealth: Payer: Self-pay | Admitting: Family Medicine

## 2016-11-28 NOTE — ED Provider Notes (Signed)
[facility] DEPT Provider Note   CSN: [id number] Arrival date & time: 11/19/16  1037     History   Chief Complaint Chief Complaint  Patient presents with  . Cellulitis    HPI Dean Aguirre is a 56 y.o. male.   Rash   This is a new problem. The current episode started more than 1 week ago. The problem has not changed since onset.The problem is associated with nothing. The maximum temperature recorded prior to his arrival was 100 to 100.9 F. The fever has been present for 1 to 2 days. The rash is present on the right lower leg and right upper leg.    Past Medical History:  Diagnosis Date  . Cellulitis of right lower leg 10/2016   Failed outpt tx; hospitalized.  Leg venous doppler neg for DVT.  Marland Kitchen NASH (nonalcoholic steatohepatitis) 10/2016   u/s during hospitalization for cellulitis 10/2016=fatty liver, LFTs elevated.  Viral Hep screen negative.    Patient Active Problem List   Diagnosis Date Noted  . Cellulitis 11/19/2016  . Transaminitis 11/19/2016  . Hyperglycemia 11/19/2016  . Foot infection 11/19/2016  . Cellulitis of right lower extremity   . Impetigo     History reviewed. No pertinent surgical history.     Home Medications    Prior to Admission medications   Medication Sig Start Date End Date Taking? Authorizing Provider  HYDROcodone-acetaminophen (NORCO/VICODIN) 5-325 MG tablet Take 1-2 tablets by mouth every 6 (six) hours as needed for moderate pain. 11/16/16  Yes Jeoffrey Massed, MD  sulfamethoxazole-trimethoprim (BACTRIM DS,SEPTRA DS) 800-160 MG tablet Take 1 tablet by mouth 2 (two) times daily. 11/24/16   Leroy Sea, MD    Family History Family History  Problem Relation Age of Onset  . Melanoma Father     Social History Social History  Substance Use Topics  . Smoking status: Never Smoker  . Smokeless tobacco: Never Used  . Alcohol use Yes     Allergies   Patient has no known allergies.   Review of Systems Review of Systems    Constitutional: Positive for fever.  Gastrointestinal: Positive for nausea.  Skin: Positive for rash.  All other systems reviewed and are negative.    Physical Exam Updated Vital Signs BP 133/78 (BP Location: Right Arm)   Pulse 74   Temp 99.2 F (37.3 C) (Oral)   Resp 17   Ht 5\' 7"  (1.702 m)   Wt 217 lb 6 oz (98.6 kg)   SpO2 95%   BMI 34.05 kg/m   Physical Exam  Constitutional: He appears well-developed and well-nourished.  HENT:  Head: Normocephalic and atraumatic.  Eyes: Conjunctivae and EOM are normal.  Neck: Normal range of motion.  Cardiovascular: Tachycardia present.   Pulmonary/Chest: Effort normal. No respiratory distress.  Abdominal: He exhibits no distension.  Musculoskeletal: Normal range of motion.  Neurological: He is alert.  Skin:     Erythematous, warm, indurated, ttp  Nursing note and vitals reviewed.      ED Treatments / Results  Labs (all labs ordered are listed, but only abnormal results are displayed) Labs Reviewed  COMPREHENSIVE METABOLIC PANEL - Abnormal; Notable for the following:       Result Value   Sodium 134 (*)    Chloride 98 (*)    Glucose, Bld 116 (*)    Albumin 2.8 (*)    AST 75 (*)    ALT 98 (*)    Alkaline Phosphatase 168 (*)    Total Bilirubin  1.8 (*)    All other components within normal limits  CBC WITH DIFFERENTIAL/PLATELET - Abnormal; Notable for the following:    WBC 13.0 (*)    Neutro Abs 10.3 (*)    All other components within normal limits  COMPREHENSIVE METABOLIC PANEL - Abnormal; Notable for the following:    Glucose, Bld 112 (*)    Calcium 8.7 (*)    Albumin 2.3 (*)    AST 114 (*)    ALT 139 (*)    Alkaline Phosphatase 174 (*)    Total Bilirubin 1.8 (*)    All other components within normal limits  CBC - Abnormal; Notable for the following:    WBC 12.5 (*)    Hemoglobin 12.0 (*)    HCT 36.8 (*)    All other components within normal limits  GLUCOSE, CAPILLARY - Abnormal; Notable for the following:     Glucose-Capillary 162 (*)    All other components within normal limits  COMPREHENSIVE METABOLIC PANEL - Abnormal; Notable for the following:    Glucose, Bld 108 (*)    Calcium 8.7 (*)    Albumin 2.3 (*)    AST 171 (*)    ALT 222 (*)    Alkaline Phosphatase 201 (*)    Total Bilirubin 1.3 (*)    All other components within normal limits  CBC - Abnormal; Notable for the following:    WBC 11.7 (*)    RBC 4.19 (*)    Hemoglobin 11.8 (*)    HCT 36.1 (*)    All other components within normal limits  GLUCOSE, CAPILLARY - Abnormal; Notable for the following:    Glucose-Capillary 147 (*)    All other components within normal limits  HEMOGLOBIN A1C - Abnormal; Notable for the following:    Hgb A1c MFr Bld 5.8 (*)    All other components within normal limits  GLUCOSE, CAPILLARY - Abnormal; Notable for the following:    Glucose-Capillary 127 (*)    All other components within normal limits  GLUCOSE, CAPILLARY - Abnormal; Notable for the following:    Glucose-Capillary 101 (*)    All other components within normal limits  HEPATIC FUNCTION PANEL - Abnormal; Notable for the following:    Albumin 2.4 (*)    AST 135 (*)    ALT 202 (*)    Alkaline Phosphatase 175 (*)    Total Bilirubin 1.3 (*)    All other components within normal limits  GLUCOSE, CAPILLARY - Abnormal; Notable for the following:    Glucose-Capillary 128 (*)    All other components within normal limits  GLUCOSE, CAPILLARY - Abnormal; Notable for the following:    Glucose-Capillary 120 (*)    All other components within normal limits  GLUCOSE, CAPILLARY - Abnormal; Notable for the following:    Glucose-Capillary 110 (*)    All other components within normal limits  GLUCOSE, CAPILLARY - Abnormal; Notable for the following:    Glucose-Capillary 132 (*)    All other components within normal limits  GLUCOSE, CAPILLARY - Abnormal; Notable for the following:    Glucose-Capillary 100 (*)    All other components within normal  limits  COMPREHENSIVE METABOLIC PANEL - Abnormal; Notable for the following:    Glucose, Bld 114 (*)    Albumin 2.7 (*)    AST 148 (*)    ALT 215 (*)    Alkaline Phosphatase 190 (*)    Total Bilirubin 1.3 (*)    All other components within normal  limits  GLUCOSE, CAPILLARY - Abnormal; Notable for the following:    Glucose-Capillary 125 (*)    All other components within normal limits  GLUCOSE, CAPILLARY - Abnormal; Notable for the following:    Glucose-Capillary 108 (*)    All other components within normal limits  GLUCOSE, CAPILLARY - Abnormal; Notable for the following:    Glucose-Capillary 132 (*)    All other components within normal limits  COMPREHENSIVE METABOLIC PANEL - Abnormal; Notable for the following:    Chloride 100 (*)    Glucose, Bld 107 (*)    Albumin 2.6 (*)    AST 97 (*)    ALT 173 (*)    Alkaline Phosphatase 165 (*)    All other components within normal limits  GLUCOSE, CAPILLARY - Abnormal; Notable for the following:    Glucose-Capillary 118 (*)    All other components within normal limits  GLUCOSE, CAPILLARY - Abnormal; Notable for the following:    Glucose-Capillary 127 (*)    All other components within normal limits  GLUCOSE, CAPILLARY - Abnormal; Notable for the following:    Glucose-Capillary 155 (*)    All other components within normal limits  CULTURE, BLOOD (ROUTINE X 2)  CULTURE, BLOOD (ROUTINE X 2)  HEPATITIS PANEL, ACUTE  I-STAT CG4 LACTIC ACID, ED  I-STAT CG4 LACTIC ACID, ED    EKG  EKG Interpretation None       Radiology No results found.  Procedures Procedures (including critical care time)  Medications Ordered in ED Medications  sodium chloride 0.9 % bolus 1,000 mL (0 mLs Intravenous Stopped 11/19/16 1534)  morphine 4 MG/ML injection 6 mg (6 mg Intravenous Given 11/19/16 1318)  vancomycin (VANCOCIN) IVPB 1000 mg/200 mL premix (0 mg Intravenous Stopped 11/19/16 1433)  mupirocin ointment (BACTROBAN) 2 % ( Topical Given 11/22/16  1103)  vancomycin (VANCOCIN) 2,000 mg in sodium chloride 0.9 % 500 mL IVPB (2,000 mg Intravenous Given 11/21/16 1246)  furosemide (LASIX) injection 20 mg (20 mg Intravenous Given 11/23/16 1257)  potassium chloride SA (K-DUR,KLOR-CON) CR tablet 20 mEq (20 mEq Oral Given 11/23/16 1256)     Initial Impression / Assessment and Plan / ED Course  I have reviewed the triage vital signs and the nursing notes.  Pertinent labs & imaging results that were available during my care of the patient were reviewed by me and considered in my medical decision making (see chart for details).  Worsening cellulitis, failed outpatient treatment. Some level of SIRS as well. Will admit for IV anbitiotics. vanc started in ER.   Final Clinical Impressions(s) / ED Diagnoses   Final diagnoses:  Transaminitis  Swelling    New Prescriptions Discharge Medication List as of 11/24/2016  3:44 PM    START taking these medications   Details  sulfamethoxazole-trimethoprim (BACTRIM DS,SEPTRA DS) 800-160 MG tablet Take 1 tablet by mouth 2 (two) times daily., Starting Sat 11/24/2016, Normal         Marily Memos, MD 11/28/16 2227

## 2016-11-28 NOTE — Telephone Encounter (Signed)
Patient's wife calling on behalf of patient to check status of FMLA forms she dropped off last week.  Please call her back on her work phone number with status, [phone] at ext [phone].

## 2016-11-28 NOTE — Telephone Encounter (Signed)
Please advise. Thanks.  

## 2016-11-28 NOTE — Telephone Encounter (Signed)
Diane, please advise if these forms were faxed. Thanks.

## 2016-11-28 NOTE — Telephone Encounter (Signed)
I did these forms and sent them up front Monday 11/26/16.

## 2016-11-29 ENCOUNTER — Encounter: Payer: Self-pay | Admitting: Family Medicine

## 2016-11-29 ENCOUNTER — Ambulatory Visit (INDEPENDENT_AMBULATORY_CARE_PROVIDER_SITE_OTHER): Payer: BC Managed Care – PPO | Admitting: Family Medicine

## 2016-11-29 VITALS — BP 115/85 | HR 90 | Temp 98.7°F | Resp 16 | Ht 67.0 in | Wt 211.0 lb

## 2016-11-29 DIAGNOSIS — K760 Fatty (change of) liver, not elsewhere classified: Secondary | ICD-10-CM | POA: Diagnosis not present

## 2016-11-29 DIAGNOSIS — L03115 Cellulitis of right lower limb: Secondary | ICD-10-CM | POA: Diagnosis not present

## 2016-11-29 DIAGNOSIS — Z1329 Encounter for screening for other suspected endocrine disorder: Secondary | ICD-10-CM

## 2016-11-29 MED ORDER — SULFAMETHOXAZOLE-TRIMETHOPRIM 800-160 MG PO TABS: 1.0000 | ORAL_TABLET | Freq: Two times a day (BID) | ORAL | 0 refills | Status: DC

## 2016-11-29 MED ORDER — HYDROCODONE-ACETAMINOPHEN 5-325 MG PO TABS: 1.0000 | ORAL_TABLET | Freq: Four times a day (QID) | ORAL | 0 refills | Status: DC | PRN

## 2016-11-29 NOTE — Addendum Note (Signed)
Addended by: Smitty KnudsenSUTHERLAND, Gregori Abril K on: 11/29/2016 10:36 AM   Modules accepted: Orders

## 2016-11-29 NOTE — Telephone Encounter (Signed)
Yes

## 2016-11-29 NOTE — Telephone Encounter (Signed)
Pt advised and voiced understanding.   

## 2016-11-29 NOTE — Progress Notes (Signed)
OFFICE VISIT  11/29/2016   CC:  Chief Complaint  Patient presents with  . Hospitalization Follow-up    Cellulitis   HPI:    Patient is a 56 y.o. Caucasian male who presents for hospital f/u for cellulitis. Reviewed hosp records. Pt admitted 1/29-11/24/16 for R leg cellulitis that failed outpt management.  He improved with IV clinda in hosp and was transitioned to bactrim at d/c for 10d..  Blood clx NEG x 5d.  Since going back home he continues to improve: less swelling, less redness, no fevers, pain moderate at times. He has about 3 days of bactrim left to take.    We discussed his hepatic steatosis dx and the possible contribution of acute illness and antibiotics to his elevated LFTs.    Past Medical History:  Diagnosis Date  . Cellulitis of right lower leg 10/2016   Failed outpt tx; hospitalized.  Leg venous doppler neg for DVT.  Marland Kitchen. NASH (nonalcoholic steatohepatitis) 10/2016   u/s during hospitalization for cellulitis 10/2016=fatty liver, LFTs elevated.  Viral Hep screen negative.    History reviewed. No pertinent surgical history.  Outpatient Medications Prior to Visit  Medication Sig Dispense Refill  . HYDROcodone-acetaminophen (NORCO/VICODIN) 5-325 MG tablet Take 1-2 tablets by mouth every 6 (six) hours as needed for moderate pain. 20 tablet 0  . sulfamethoxazole-trimethoprim (BACTRIM DS,SEPTRA DS) 800-160 MG tablet Take 1 tablet by mouth 2 (two) times daily. 14 tablet 0   No facility-administered medications prior to visit.     No Known Allergies  ROS As per HPI  PE: Blood pressure 115/85, pulse 90, temperature 98.7 F (37.1 C), temperature source Oral, resp. rate 16, height 5\' 7"  (1.702 m), weight 211 lb (95.7 kg), SpO2 97 %. Gen: Alert, well appearing.  Patient is oriented to person, place, time, and situation. AFFECT: pleasant, lucid thought and speech. Right leg: from knee down to ankle he has mild erythema, warmth, and tenderness that is most prominent  anteriorly.  1-2+ pitting edema on RLL.  Some superficial desquamation is noted just inferior to patella.   LABS:  Lab Results  Component Value Date   WBC 11.7 (H) 11/21/2016   HGB 11.8 (L) 11/21/2016   HCT 36.1 (L) 11/21/2016   MCV 86.2 11/21/2016   PLT 391 11/21/2016     Chemistry      Component Value Date/Time   NA 135 11/24/2016 0628   K 4.2 11/24/2016 0628   CL 100 (L) 11/24/2016 0628   CO2 25 11/24/2016 0628   BUN 11 11/24/2016 0628   CREATININE 0.97 11/24/2016 0628      Component Value Date/Time   CALCIUM 9.0 11/24/2016 0628   ALKPHOS 165 (H) 11/24/2016 0628   AST 97 (H) 11/24/2016 0628   ALT 173 (H) 11/24/2016 0628   BILITOT 1.2 11/24/2016 0628      IMPRESSION AND PLAN:  1) Right LE cellulitis: improving s/p hospitalization. Continue bactrim: I gave another 7d bactrim rx for him to complete after he finishes current bactrim course. Vicodin 5/325, 1-2 q6hprn, #30: rx handed to pt today.  2) Hepatic steatosis: discussed dx, importance of monitoring.  Importance of wt loss as treatment. Also will screen next week for hyperlipidemia and treat if present. In fact, next week in office f/u we'll check fasting CBC, CMET, TSH, and lipid panel.  An After Visit Summary was printed and given to the patient.  FOLLOW UP: Return in about 1 week (around 12/06/2016) for f/u cellulitis and get fasting labs.  Signed:  Santiago Bumpers, MD           11/29/2016

## 2016-11-29 NOTE — Progress Notes (Signed)
Pre visit review using our clinic review tool, if applicable. No additional management support is needed unless otherwise documented below in the visit note. 

## 2016-12-05 ENCOUNTER — Other Ambulatory Visit (INDEPENDENT_AMBULATORY_CARE_PROVIDER_SITE_OTHER): Payer: BC Managed Care – PPO

## 2016-12-05 DIAGNOSIS — K760 Fatty (change of) liver, not elsewhere classified: Secondary | ICD-10-CM | POA: Diagnosis not present

## 2016-12-05 DIAGNOSIS — L03115 Cellulitis of right lower limb: Secondary | ICD-10-CM | POA: Diagnosis not present

## 2016-12-05 DIAGNOSIS — Z1329 Encounter for screening for other suspected endocrine disorder: Secondary | ICD-10-CM

## 2016-12-05 LAB — LIPID PANEL
CHOL/HDL RATIO: 6
CHOLESTEROL: 228 mg/dL — AB (ref 0–200)
HDL: 37.2 mg/dL — ABNORMAL LOW (ref >39.00)
LDL CALC: 153 mg/dL — AB (ref 0–99)
NonHDL: 190.98
Triglycerides: 191 mg/dL — ABNORMAL HIGH (ref 0.0–149.0)
VLDL: 38.2 mg/dL (ref 0.0–40.0)

## 2016-12-05 LAB — COMPREHENSIVE METABOLIC PANEL
ALT: 61 U/L — AB (ref 0–53)
AST: 35 U/L (ref 0–37)
Albumin: 4 g/dL (ref 3.5–5.2)
Alkaline Phosphatase: 118 U/L — ABNORMAL HIGH (ref 39–117)
BILIRUBIN TOTAL: 0.6 mg/dL (ref 0.2–1.2)
BUN: 13 mg/dL (ref 6–23)
CALCIUM: 9.6 mg/dL (ref 8.4–10.5)
CO2: 28 meq/L (ref 19–32)
CREATININE: 1.18 mg/dL (ref 0.40–1.50)
Chloride: 102 mEq/L (ref 96–112)
GFR: 68.42 mL/min (ref >60.00)
Glucose, Bld: 73 mg/dL (ref 70–99)
Potassium: 4.7 mEq/L (ref 3.5–5.1)
Sodium: 134 mEq/L — ABNORMAL LOW (ref 135–145)
Total Protein: 8.1 g/dL (ref 6.0–8.3)

## 2016-12-05 LAB — TSH: TSH: 1.43 u[IU]/mL (ref 0.35–4.50)

## 2016-12-06 LAB — CBC WITH DIFFERENTIAL/PLATELET
BASOS ABS: 0 10*3/uL (ref 0.0–0.1)
Basophils Relative: 0.4 % (ref 0.0–3.0)
EOS ABS: 0.3 10*3/uL (ref 0.0–0.7)
Eosinophils Relative: 4.8 % (ref 0.0–5.0)
HEMATOCRIT: 40.9 % (ref 39.0–52.0)
HEMOGLOBIN: 13.5 g/dL (ref 13.0–17.0)
LYMPHS PCT: 29.1 % (ref 12.0–46.0)
Lymphs Abs: 1.6 10*3/uL (ref 0.7–4.0)
MCHC: 33.1 g/dL (ref 30.0–36.0)
MCV: 86 fl (ref 78.0–100.0)
MONOS PCT: 9.2 % (ref 3.0–12.0)
Monocytes Absolute: 0.5 10*3/uL (ref 0.1–1.0)
NEUTROS ABS: 3.1 10*3/uL (ref 1.4–7.7)
Neutrophils Relative %: 56.5 % (ref 43.0–77.0)
PLATELETS: 410 10*3/uL — AB (ref 150.0–400.0)
RBC: 4.75 Mil/uL (ref 4.22–5.81)
RDW: 13.6 % (ref 11.5–15.5)
WBC: 5.4 10*3/uL (ref 4.0–10.5)

## 2016-12-07 ENCOUNTER — Encounter: Payer: Self-pay | Admitting: Family Medicine

## 2016-12-07 ENCOUNTER — Ambulatory Visit (INDEPENDENT_AMBULATORY_CARE_PROVIDER_SITE_OTHER): Payer: BC Managed Care – PPO | Admitting: Family Medicine

## 2016-12-07 VITALS — BP 123/77 | HR 84 | Temp 98.8°F | Resp 16 | Ht 67.0 in | Wt 216.2 lb

## 2016-12-07 DIAGNOSIS — Z23 Encounter for immunization: Secondary | ICD-10-CM

## 2016-12-07 DIAGNOSIS — L03115 Cellulitis of right lower limb: Secondary | ICD-10-CM | POA: Diagnosis not present

## 2016-12-07 DIAGNOSIS — Z125 Encounter for screening for malignant neoplasm of prostate: Secondary | ICD-10-CM

## 2016-12-07 DIAGNOSIS — Z114 Encounter for screening for human immunodeficiency virus [HIV]: Secondary | ICD-10-CM | POA: Diagnosis not present

## 2016-12-07 DIAGNOSIS — Z0000 Encounter for general adult medical examination without abnormal findings: Secondary | ICD-10-CM | POA: Diagnosis not present

## 2016-12-07 LAB — PSA: PSA: 2.89 ng/mL (ref 0.10–4.00)

## 2016-12-07 MED ORDER — AMOXICILLIN-POT CLAVULANATE 875-125 MG PO TABS: 1.0000 | ORAL_TABLET | Freq: Two times a day (BID) | ORAL | 0 refills | Status: DC

## 2016-12-07 MED ORDER — HYDROCODONE-ACETAMINOPHEN 5-325 MG PO TABS: 1.0000 | ORAL_TABLET | Freq: Four times a day (QID) | ORAL | 0 refills | Status: DC | PRN

## 2016-12-07 NOTE — Progress Notes (Signed)
Pre visit review using our clinic review tool, if applicable. No additional management support is needed unless otherwise documented below in the visit note. 

## 2016-12-07 NOTE — Progress Notes (Signed)
Office Note 12/07/2016  CC:  Chief Complaint  Patient presents with  . Follow-up    cellulitis  . Annual Exam    labs done 12/05/16    HPI:  Dean Aguirre is a 56 y.o. White male who is here for f/u R leg cellulitis and get annual health maintenance exam. We reviewed his recent fasting health panel labs in detail today.  Mildly elevated cholesterol but 10 year CV risk by framingham criteria is 6%, so no statin recommended. Mild elevated ALT attributable to NASH.  Last f/u 8 d/a I gave him another 7d course of bactrim for his RLE cellulitis. Leg continues to improve but still with some tightness and pain/sensory changes.   No fever.    Past Medical History:  Diagnosis Date  . Cellulitis of right lower leg 10/2016   Failed outpt tx; hospitalized.  Leg venous doppler neg for DVT.  Marland Kitchen NASH (nonalcoholic steatohepatitis) 10/2016   u/s during hospitalization for cellulitis 10/2016=fatty liver, LFTs elevated.  Viral Hep screen negative.    History reviewed. No pertinent surgical history.  Family History  Problem Relation Age of Onset  . Melanoma Father     Social History   Social History  . Marital status: Married    Spouse name: N/A  . Number of children: N/A  . Years of education: N/A   Occupational History  . Not on file.   Social History Main Topics  . Smoking status: Never Smoker  . Smokeless tobacco: Never Used  . Alcohol use Yes  . Drug use: No  . Sexual activity: Not on file   Other Topics Concern  . Not on file   Social History Narrative   Married, 2 stepchildren, one lives with him.   Educ: HS   Orig from [place], relocated approx 2012.   Occup: Plumber   No tob, typically has a beer with dinner.      Outpatient Medications Prior to Visit  Medication Sig Dispense Refill  . HYDROcodone-acetaminophen (NORCO/VICODIN) 5-325 MG tablet Take 1-2 tablets by mouth every 6 (six) hours as needed for moderate pain. 30 tablet 0  . sulfamethoxazole-trimethoprim  (BACTRIM DS,SEPTRA DS) 800-160 MG tablet Take 1 tablet by mouth 2 (two) times daily. 14 tablet 0   No facility-administered medications prior to visit.     No Known Allergies  ROS Review of Systems  Constitutional: Negative for appetite change, chills, fatigue and fever.  HENT: Negative for congestion, dental problem, ear pain and sore throat.   Eyes: Negative for discharge, redness and visual disturbance.  Respiratory: Negative for cough, chest tightness, shortness of breath and wheezing.   Cardiovascular: Negative for chest pain, palpitations and leg swelling.  Gastrointestinal: Negative for abdominal pain, blood in stool, diarrhea, nausea and vomiting.  Genitourinary: Negative for difficulty urinating, dysuria, flank pain, frequency, hematuria and urgency.  Musculoskeletal: Negative for arthralgias, back pain, joint swelling, myalgias and neck stiffness.  Skin: Positive for rash. Negative for pallor.       See HPI  Neurological: Negative for dizziness, speech difficulty, weakness and headaches.  Hematological: Negative for adenopathy. Does not bruise/bleed easily.  Psychiatric/Behavioral: Negative for confusion and sleep disturbance. The patient is not nervous/anxious.     PE; Blood pressure 123/77, pulse 84, temperature 98.8 F (37.1 C), temperature source Oral, resp. rate 16, height 5\' 7"  (1.702 m), weight 216 lb 4 oz (98.1 kg), SpO2 95 %. Gen: Alert, well appearing.  Patient is oriented to person, place, time, and situation. AFFECT: pleasant,  lucid thought and speech. ENT: Ears: EACs clear, normal epithelium.  TMs with good light reflex and landmarks bilaterally.  Eyes: no injection, icteris, swelling, or exudate.  EOMI, PERRLA. Nose: no drainage or turbinate edema/swelling.  No injection or focal lesion.  Mouth: lips without lesion/swelling.  Oral mucosa pink and moist.  Dentition intact and without obvious caries or gingival swelling.  Oropharynx without erythema, exudate, or  swelling.  Neck: supple/nontender.  No LAD, mass, or TM.  Carotid pulses 2+ bilaterally, without bruits. CV: RRR, no m/r/g.   LUNGS: CTA bilat, nonlabored resps, good aeration in all lung fields. ABD: soft, NT, ND, BS normal.  No hepatospenomegaly or mass.  No bruits. EXT: no clubbing, cyanosis, or edema.   Right lower leg (starting at infrapatellar bursa) has mild erythema and warmth anterolaterally down to upper aspect of ankle.  No pitting edema.  Mild TTP. Musculoskeletal: no joint swelling, erythema, warmth, or tenderness.  ROM of all joints intact. Skin - no sores or suspicious lesions or rashes or color changes Rectal exam: negative without mass, lesions or tenderness, PROSTATE EXAM: smooth and symmetric without nodules or tenderness.   Pertinent labs:  Lab Results  Component Value Date   TSH 1.43 12/05/2016   Lab Results  Component Value Date   WBC 5.4 12/05/2016   HGB 13.5 12/05/2016   HCT 40.9 12/05/2016   MCV 86.0 12/05/2016   PLT 410.0 (H) 12/05/2016   Lab Results  Component Value Date   CREATININE 1.18 12/05/2016   BUN 13 12/05/2016   NA 134 (L) 12/05/2016   K 4.7 12/05/2016   CL 102 12/05/2016   CO2 28 12/05/2016   Lab Results  Component Value Date   ALT 61 (H) 12/05/2016   AST 35 12/05/2016   ALKPHOS 118 (H) 12/05/2016   BILITOT 0.6 12/05/2016   Lab Results  Component Value Date   CHOL 228 (H) 12/05/2016   Lab Results  Component Value Date   HDL 37.20 (L) 12/05/2016   Lab Results  Component Value Date   LDLCALC 153 (H) 12/05/2016   Lab Results  Component Value Date   TRIG 191.0 (H) 12/05/2016   Lab Results  Component Value Date   CHOLHDL 6 12/05/2016    ASSESSMENT AND PLAN:   1) RLE cellulitis: slow to resolve.  We've been treating as if MRSA.  Will change over to augmentin 875mg  bid x 10d and see if it responds better to treatment as MSSA or strep.  Doubt exposure to broken sewer line/sewage about a week prior to onset of cellulitis is  playing a role, but will keep this in mind. Vicodin 5/325, 1-2 q6h prn, #30--rx given again today.  2) Health maintenance exam: Reviewed age and gender appropriate health maintenance issues (prudent diet, regular exercise, health risks of tobacco and excessive alcohol, use of seatbelts, fire alarms in home, use of sunscreen).  Also reviewed age and gender appropriate health screening as well as vaccine recommendations. Tdap today.  HIV screening today. Reviewed recent HP labs in detail with pt today: mild hyperlipidemia--to be treated with TLC.   Prostate ca screening: DRE normal, PSA drawn today. Colon cancer screening: pt elects to defer colonoscopy and wants to do cologuard testing so we ordered this today.  An After Visit Summary was printed and given to the patient.  FOLLOW UP:  Return in about 1 week (around 12/14/2016) for f/u cellulitis.  Signed:  Santiago BumpersPhil Arantxa Piercey, MD  12/07/2016  

## 2016-12-07 NOTE — Addendum Note (Signed)
Addended by: Smitty KnudsenSUTHERLAND, Meer Reindl K on: 12/07/2016 11:09 AM   Modules accepted: Orders

## 2016-12-08 LAB — HIV ANTIBODY (ROUTINE TESTING W REFLEX): HIV 1&2 Ab, 4th Generation: NONREACTIVE

## 2016-12-17 ENCOUNTER — Encounter: Payer: Self-pay | Admitting: Family Medicine

## 2016-12-17 ENCOUNTER — Ambulatory Visit (INDEPENDENT_AMBULATORY_CARE_PROVIDER_SITE_OTHER): Payer: BC Managed Care – PPO | Admitting: Family Medicine

## 2016-12-17 VITALS — BP 150/84 | HR 103 | Temp 98.9°F | Resp 16 | Ht 67.0 in | Wt 216.8 lb

## 2016-12-17 DIAGNOSIS — L03115 Cellulitis of right lower limb: Secondary | ICD-10-CM

## 2016-12-17 MED ORDER — CIPROFLOXACIN HCL 500 MG PO TABS: 500.0000 mg | ORAL_TABLET | Freq: Two times a day (BID) | ORAL | 0 refills | Status: AC

## 2016-12-17 MED ORDER — HYDROCODONE-ACETAMINOPHEN 5-325 MG PO TABS: 1.0000 | ORAL_TABLET | Freq: Four times a day (QID) | ORAL | 0 refills | Status: DC | PRN

## 2016-12-17 NOTE — Progress Notes (Signed)
Pre visit review using our clinic review tool, if applicable. No additional management support is needed unless otherwise documented below in the visit note. 

## 2016-12-17 NOTE — Progress Notes (Signed)
OFFICE NOTE  12/17/2016  CC:  Chief Complaint  Patient presents with  . Follow-up    cellulitis   HPI:   Patient is a 56 y.o. Caucasian male who is here for 1 week f/u R lower leg cellulitis. Last visit I changed his abx to augmentin.  He continues to gradually feel improvement in his right lower leg pain and swelling.  No fever or malaise.  No R knee swelling, but it feels stiff and he can't fully flex it. The skin at the inferior patellar area on tibia desquamated and has fresh-looking skin layer present.  Pertinent PMH:  Hepatic steatosis  MEDS;   Outpatient Medications Prior to Visit  Medication Sig Dispense Refill  . amoxicillin-clavulanate (AUGMENTIN) 875-125 MG tablet Take 1 tablet by mouth 2 (two) times daily. 20 tablet 0  . HYDROcodone-acetaminophen (NORCO/VICODIN) 5-325 MG tablet Take 1-2 tablets by mouth every 6 (six) hours as needed for moderate pain. 30 tablet 0   No facility-administered medications prior to visit.     PE: Blood pressure (!) 150/84, pulse (!) 103, temperature 98.9 F (37.2 C), temperature source Oral, resp. rate 16, height 5\' 7"  (1.702 m), weight 216 lb 12 oz (98.3 kg), SpO2 96 %. Gen: Alert, well appearing.  Patient is oriented to person, place, time, and situation. AFFECT: pleasant, lucid thought and speech. Right leg: mild diffuse pinkish hue to anterior aspect of R LL from infrapatellar area down to ankle, with a mild swelling of infrapatellar bursa region---no fluctuance to suggest an abscess.  Minimal tenderness to palpation of this region, and less tenderness in LL anterior region diffusely.  Warmth was similar to L LL. No skin peeling/desquamation.  2+ pitting edema RLL, 1+ pitting edema L LL. Right knee does not have effusion or peri-articular soft tissue swelling.  LABS: Lab Results  Component Value Date   TSH 1.43 12/05/2016   Lab Results  Component Value Date   WBC 5.4 12/05/2016   HGB 13.5 12/05/2016   HCT 40.9 12/05/2016   MCV  86.0 12/05/2016   PLT 410.0 (H) 12/05/2016   Lab Results  Component Value Date   CREATININE 1.18 12/05/2016   BUN 13 12/05/2016   NA 134 (L) 12/05/2016   K 4.7 12/05/2016   CL 102 12/05/2016   CO2 28 12/05/2016   Lab Results  Component Value Date   ALT 61 (H) 12/05/2016   AST 35 12/05/2016   ALKPHOS 118 (H) 12/05/2016   BILITOT 0.6 12/05/2016   Lab Results  Component Value Date   CHOL 228 (H) 12/05/2016   Lab Results  Component Value Date   HDL 37.20 (L) 12/05/2016   Lab Results  Component Value Date   LDLCALC 153 (H) 12/05/2016   Lab Results  Component Value Date   TRIG 191.0 (H) 12/05/2016   Lab Results  Component Value Date   CHOLHDL 6 12/05/2016   Lab Results  Component Value Date   PSA 2.89 12/07/2016   Lab Results  Component Value Date   HGBA1C 5.8 (H) 11/21/2016   IMPRESSION AND PLAN:  1) Right lower leg cellulitis: continues to improve but I just don't feel like it is completely clinically resolved. Will treat for another 10d for the possibility of pseudomonas --ciprofloxacin 500 mg bid x 10d. I rx'd another 30 of the vicodin 5/325 for him to use sparingly since he still has some periods of significant discomfort.  An After Visit Summary was printed and given to the patient.  FOLLOW UP:  Return in about 2 weeks (around 12/31/2016) for f/u cellulitis.  Signed:  Santiago Bumpers, MD           12/17/2016

## 2016-12-20 DIAGNOSIS — Z1211 Encounter for screening for malignant neoplasm of colon: Secondary | ICD-10-CM

## 2016-12-20 HISTORY — DX: Encounter for screening for malignant neoplasm of colon: Z12.11

## 2016-12-24 ENCOUNTER — Encounter: Payer: Self-pay | Admitting: Family Medicine

## 2017-01-01 ENCOUNTER — Encounter: Payer: Self-pay | Admitting: Family Medicine

## 2017-01-01 NOTE — Progress Notes (Signed)
OFFICE VISIT  01/02/2017   CC:  Chief Complaint  Patient presents with  . Follow-up    Cellulitis   HPI:    Patient is a 56 y.o. Caucasian male who presents for 2 week f/u R lower leg cellulitis. Last visit I was not satisfied with his improvement overall so I gave him a 10 d course of cipro to cover for pseudomonas. Also, I gave #30 more vicodin 5/325 to use 1-2 q6h prn pain.  He took the full course of cipro. Leg is doing better but still has some mild swelling of LL and stiffness of R knee.   Only hurts occasionally, esp when kneeling and putting wt on knee on hard surface.  He has to kneel on it commonly as part of his job duties.  He feels like he'll need his time out of work extended--he estimates 01/21/17. No fevers or malaise.   Past Medical History:  Diagnosis Date  . Cellulitis of right lower leg 10/2016   Failed outpt tx; hospitalized.  Leg venous doppler neg for DVT.  Marland Kitchen Colon cancer screening 12/2016   Cologuard ordered/planned but insurance would not cover.  . Hyperlipidemia 11/2016   TLC recommended.  Marland Kitchen NASH (nonalcoholic steatohepatitis) 10/2016   u/s during hospitalization for cellulitis 10/2016=fatty liver, LFTs elevated.  Viral Hep screen negative.    History reviewed. No pertinent surgical history.  Outpatient Medications Prior to Visit  Medication Sig Dispense Refill  . HYDROcodone-acetaminophen (NORCO/VICODIN) 5-325 MG tablet Take 1-2 tablets by mouth every 6 (six) hours as needed for moderate pain. 30 tablet 0   No facility-administered medications prior to visit.     No Known Allergies  ROS As per HPI  PE: Blood pressure 124/85, pulse 82, temperature 98.9 F (37.2 C), temperature source Oral, resp. rate 16, height 5\' 7"  (1.702 m), weight 222 lb 4 oz (100.8 kg), SpO2 98 %. Gen: Alert, well appearing.  Patient is oriented to person, place, time, and situation. AFFECT: pleasant, lucid thought and speech. R knee w/out significant effusion,  nontender.  From inferior border of patella down to ankle his anterior lower leg has mild pinkish hue and 1+ pitting edema.  Mild diffuse anterior LL tenderness but primarily in area just inferior to patella.  Subtle increased warmth compared to L LL. R knee ROM intact but pt feels full/stiff in R knee with flexion past 90 deg.  LABS:  Lab Results  Component Value Date   WBC 5.4 12/05/2016   HGB 13.5 12/05/2016   HCT 40.9 12/05/2016   MCV 86.0 12/05/2016   PLT 410.0 (H) 12/05/2016     Chemistry      Component Value Date/Time   NA 134 (L) 12/05/2016 0801   K 4.7 12/05/2016 0801   CL 102 12/05/2016 0801   CO2 28 12/05/2016 0801   BUN 13 12/05/2016 0801   CREATININE 1.18 12/05/2016 0801      Component Value Date/Time   CALCIUM 9.6 12/05/2016 0801   ALKPHOS 118 (H) 12/05/2016 0801   AST 35 12/05/2016 0801   ALT 61 (H) 12/05/2016 0801   BILITOT 0.6 12/05/2016 0801     Lab Results  Component Value Date   CHOL 228 (H) 12/05/2016   HDL 37.20 (L) 12/05/2016   LDLCALC 153 (H) 12/05/2016   TRIG 191.0 (H) 12/05/2016   CHOLHDL 6 12/05/2016    IMPRESSION AND PLAN:  1) R LL cellulitis: I feel much more confident that his cellulitis is resolved. We'll do no further  abx and see how he does. Due to mild ongoing RLL edema + R knee stiffness I'd like to recheck a RLE venous doppler US to r/o DVT. Encouraged pt to walk as tolerated at home.  No further pain meds rx'd today. Anticipate return to work 01/21/17---FMLA paperwork adjusted today to reflect this.  An After Visit Summary was printed and given to the patient.  FOLLOW UP: Return in about 6 months (around 07/05/2017) for routine chronic illness f/u.  Signed:  Santiago BumpersPhil McGowen, MD           01/02/2017

## 2017-01-02 ENCOUNTER — Ambulatory Visit (INDEPENDENT_AMBULATORY_CARE_PROVIDER_SITE_OTHER): Payer: BC Managed Care – PPO | Admitting: Family Medicine

## 2017-01-02 ENCOUNTER — Encounter: Payer: Self-pay | Admitting: Family Medicine

## 2017-01-02 VITALS — BP 124/85 | HR 82 | Temp 98.9°F | Resp 16 | Ht 67.0 in | Wt 222.2 lb

## 2017-01-02 DIAGNOSIS — L03115 Cellulitis of right lower limb: Secondary | ICD-10-CM

## 2017-01-02 DIAGNOSIS — M7989 Other specified soft tissue disorders: Secondary | ICD-10-CM | POA: Diagnosis not present

## 2017-01-02 NOTE — Progress Notes (Signed)
Pre visit review using our clinic review tool, if applicable. No additional management support is needed unless otherwise documented below in the visit note. 

## 2017-01-10 ENCOUNTER — Ambulatory Visit (HOSPITAL_BASED_OUTPATIENT_CLINIC_OR_DEPARTMENT_OTHER)
Admission: RE | Admit: 2017-01-10 | Discharge: 2017-01-10 | Disposition: A | Discharge status: Home / Self Care | Payer: BC Managed Care – PPO | Source: Ambulatory Visit | Attending: Family Medicine | Admitting: Family Medicine

## 2017-01-10 ENCOUNTER — Encounter: Payer: Self-pay | Admitting: Family Medicine

## 2017-01-10 DIAGNOSIS — L03115 Cellulitis of right lower limb: Secondary | ICD-10-CM | POA: Insufficient documentation

## 2017-01-10 DIAGNOSIS — M7989 Other specified soft tissue disorders: Secondary | ICD-10-CM | POA: Insufficient documentation

## 2017-01-11 ENCOUNTER — Telehealth: Payer: Self-pay | Admitting: Family Medicine

## 2017-01-11 ENCOUNTER — Encounter: Payer: Self-pay | Admitting: *Deleted

## 2017-01-11 NOTE — Telephone Encounter (Signed)
New work Public house managerletter printed, signed and faxed to number below as requested by pt.

## 2017-01-11 NOTE — Telephone Encounter (Signed)
Patient called to advise that he needs his work letter that he picked up today to read "patient can return April 2nd without restrictions."  Please fax to Ignatius SpeckingDonna Burnette at [facility] at [phone].  Please call patient at [phone] with any questions.

## 2017-07-05 ENCOUNTER — Ambulatory Visit: Payer: BC Managed Care – PPO | Admitting: Family Medicine

## 2017-12-01 IMAGING — US US ABDOMEN LIMITED
1 series · 14 of 25 positions shown · non-contrast
Comparison: No recent prior .

CLINICAL DATA: Elevated transaminases.

EXAM:
US ABDOMEN LIMITED - RIGHT UPPER QUADRANT

[Series 1: us abdomen limited · 0.25mm/px · 14 of 44 slices shown]
[im 1/44]
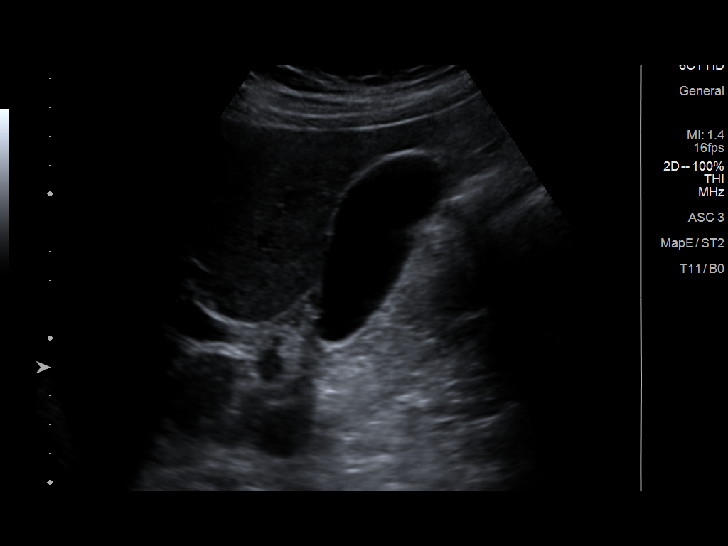
[im 4/44]
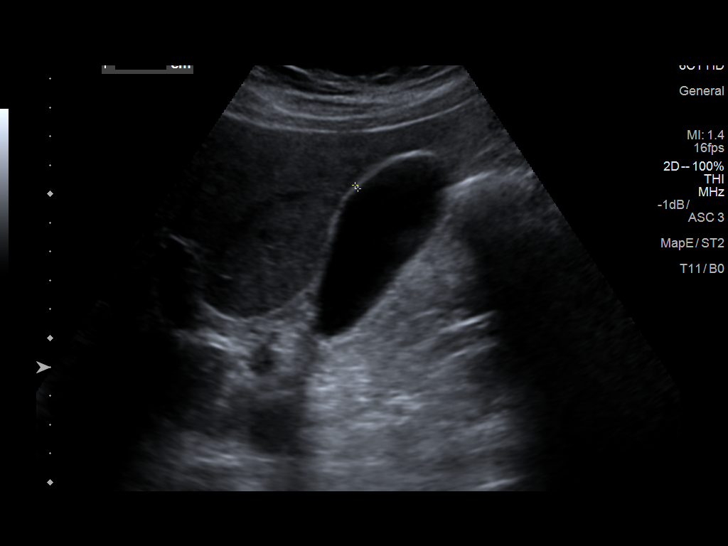
[im 8/44]
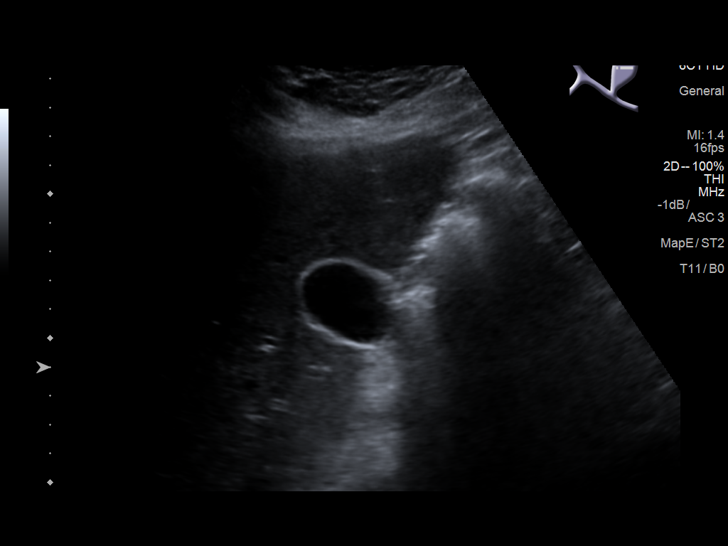
[im 11/44]
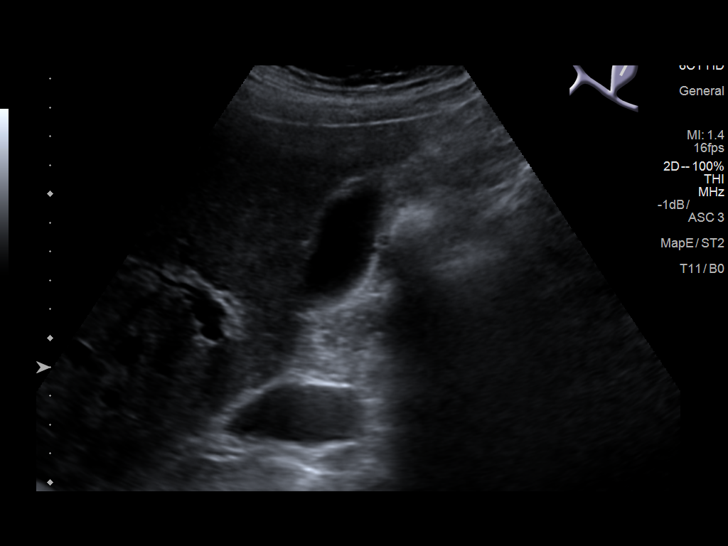
[im 15/44]
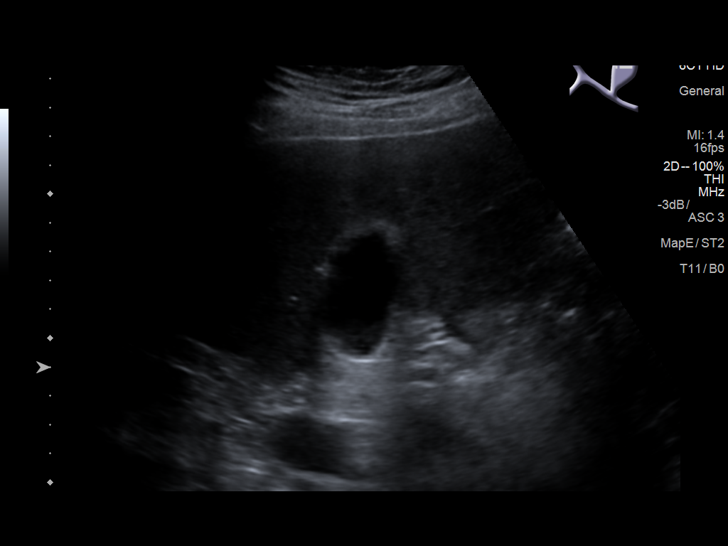
[im 17/44]
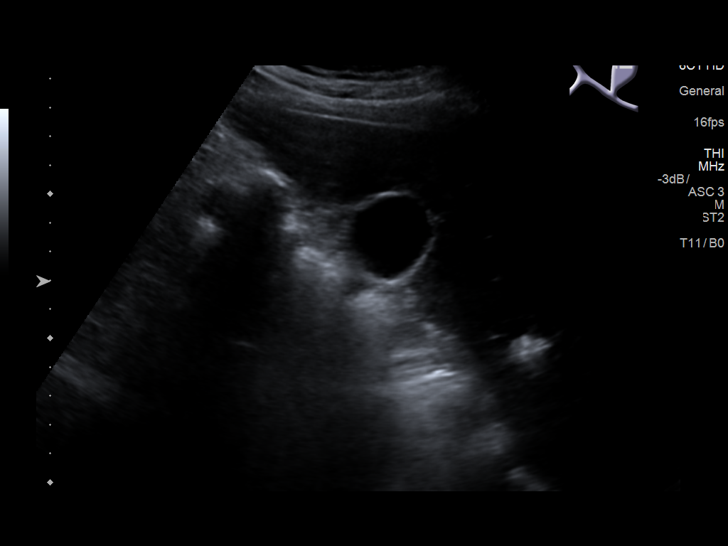
[im 20/44]
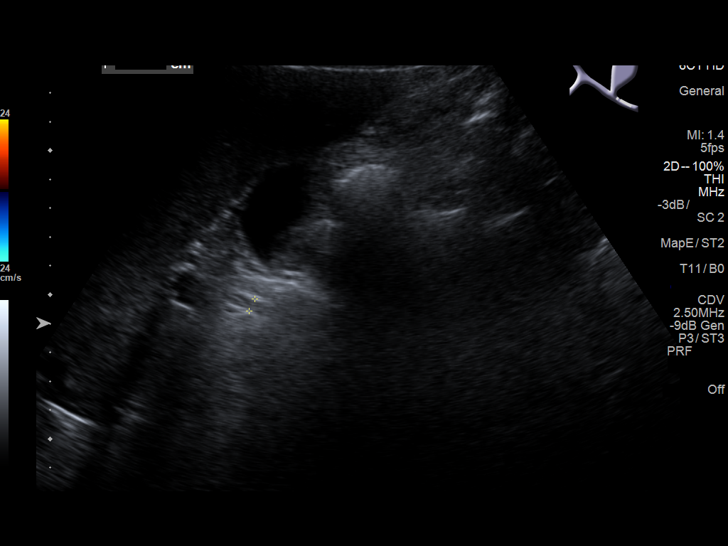
[im 24/44]
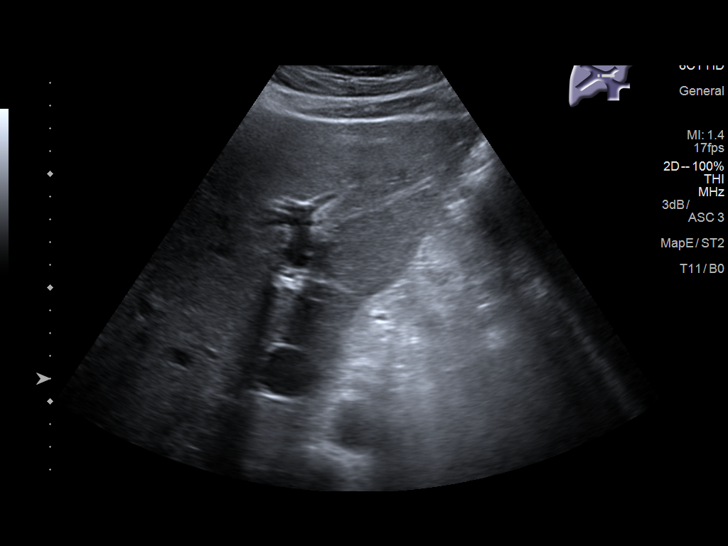
[im 27/44]
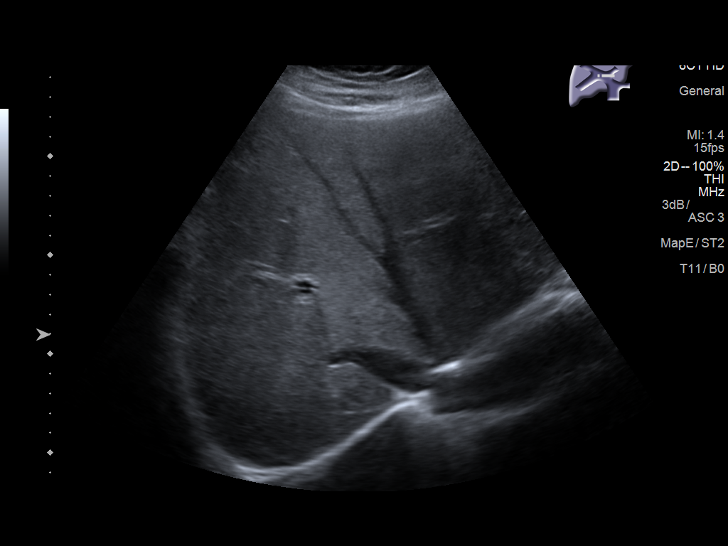
[im 29/44]
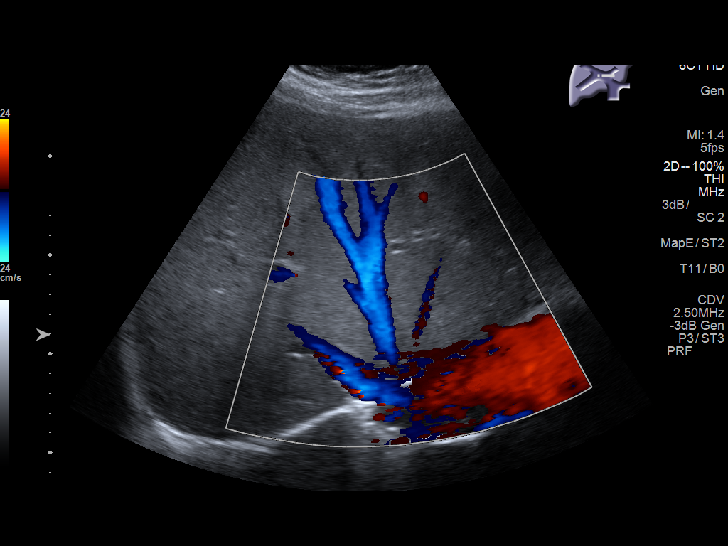
[im 33/44]
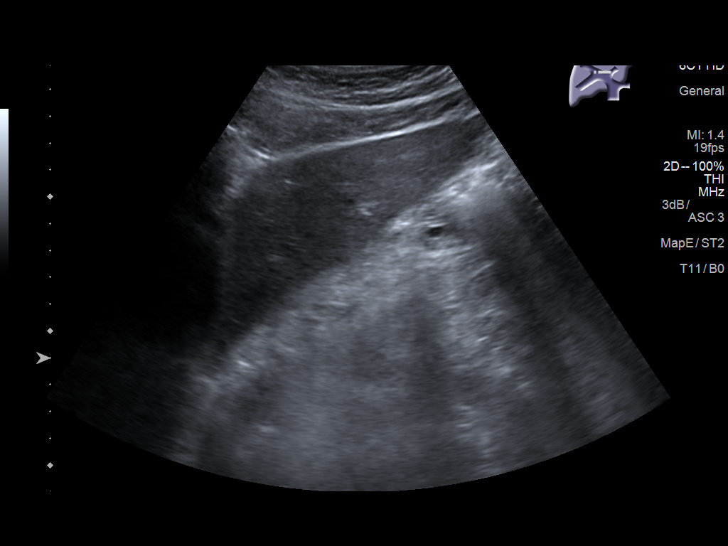
[im 36/44]
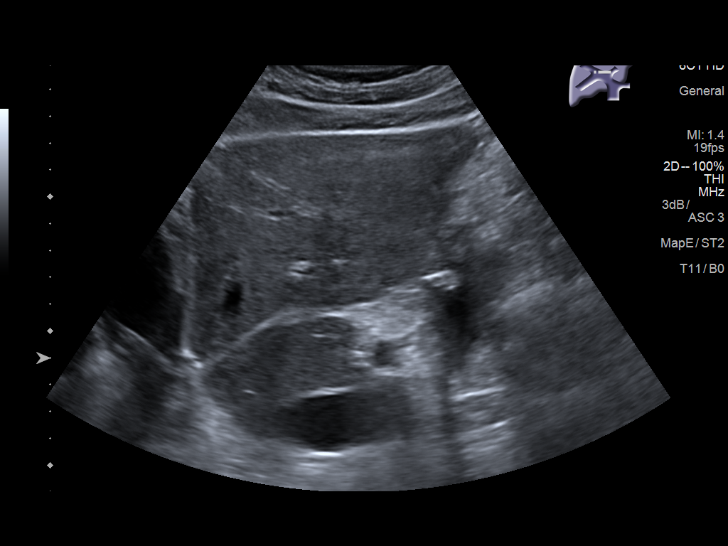
[im 40/44]
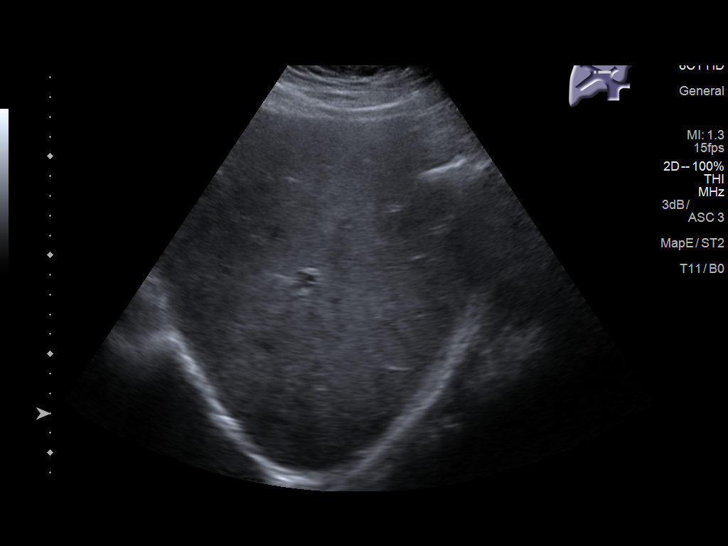
[im 44/44]
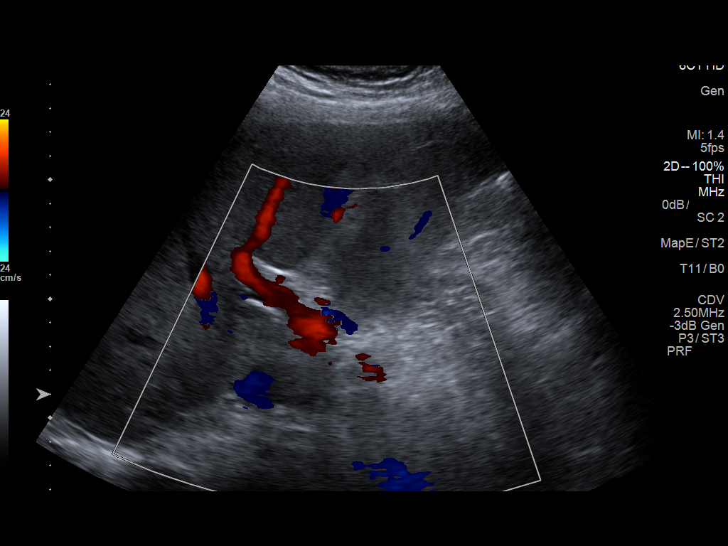

[14 of 25 positions shown; findings below may reference images not displayed]

FINDINGS: Gallbladder:

No gallstones or wall thickening visualized. No sonographic Murphy
sign noted by sonographer.

Common bile duct:

Diameter: 4.6 mm

Liver:

There is echogenic consistent fatty infiltration and/or
hepatocellular disease. No focal hepatic abnormality identified .
IMPRESSION: The liver is echogenic consistent with fatty infiltration and/or
hepatocellular disease. No acute or focal abnormality identified. No
gallstones or biliary distention.

## 2018-01-20 IMAGING — US US EXTREM LOW VENOUS*R*
1 series · 13 of 24 positions shown · non-contrast
Comparison: None.

CLINICAL DATA: Right lower extremity swelling with cellulitis



[Series 1: us extrem low venous*right* · 0.08mm/px · 13 of 40 slices shown]
[im 1/40]
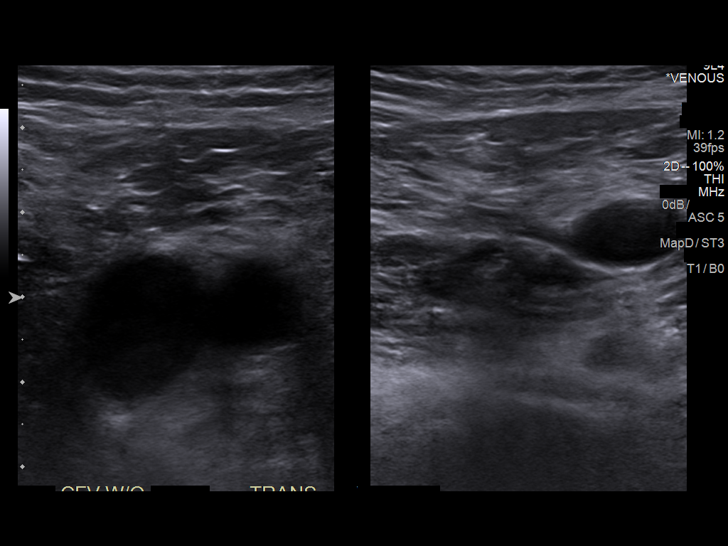
[im 4/40]
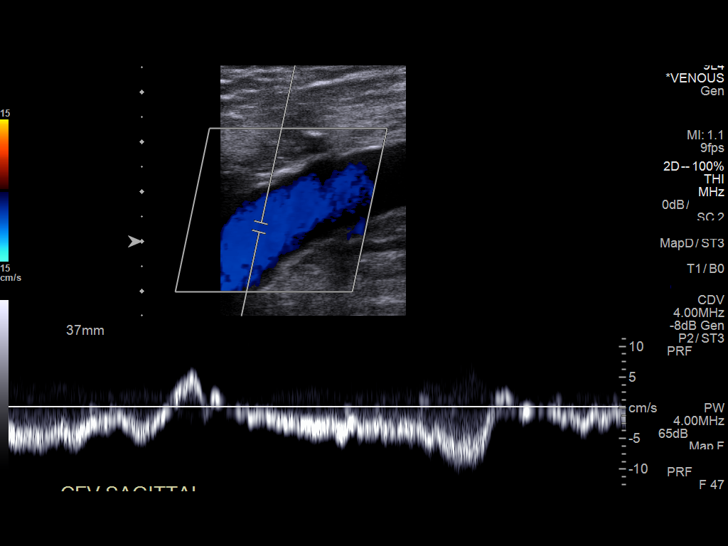
[im 7/40]
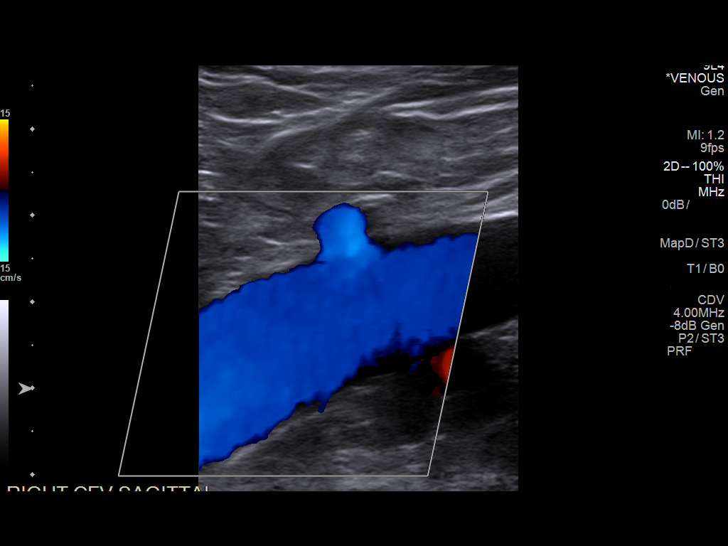
[im 11/40]
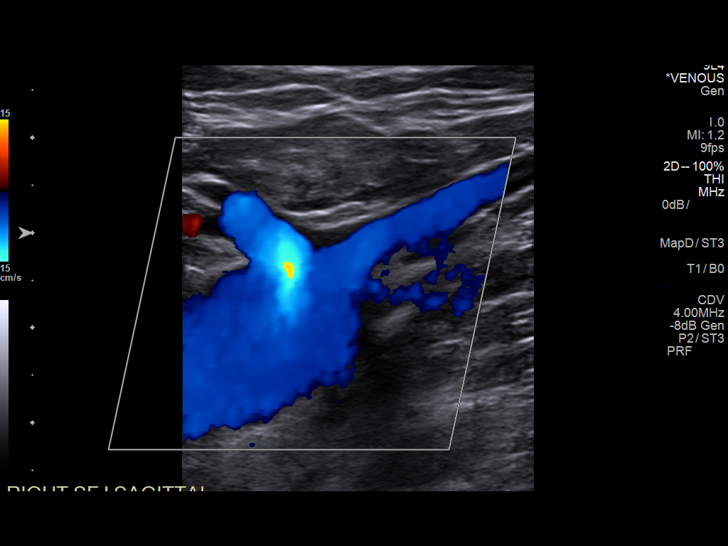
[im 14/40]
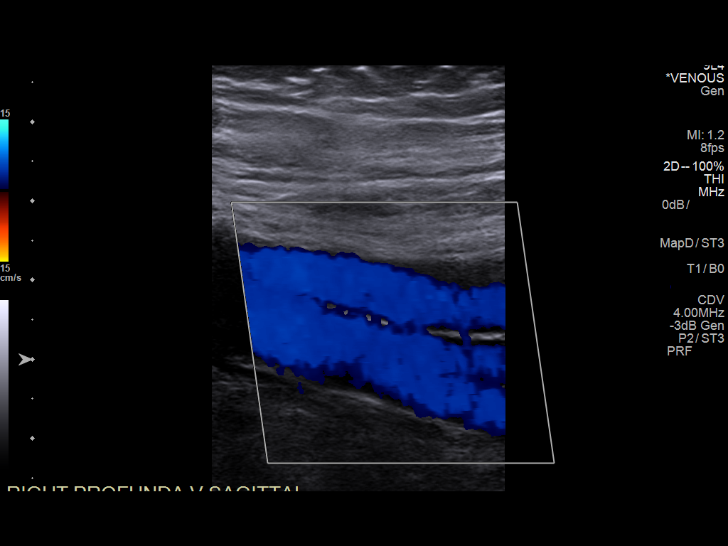
[im 17/40]
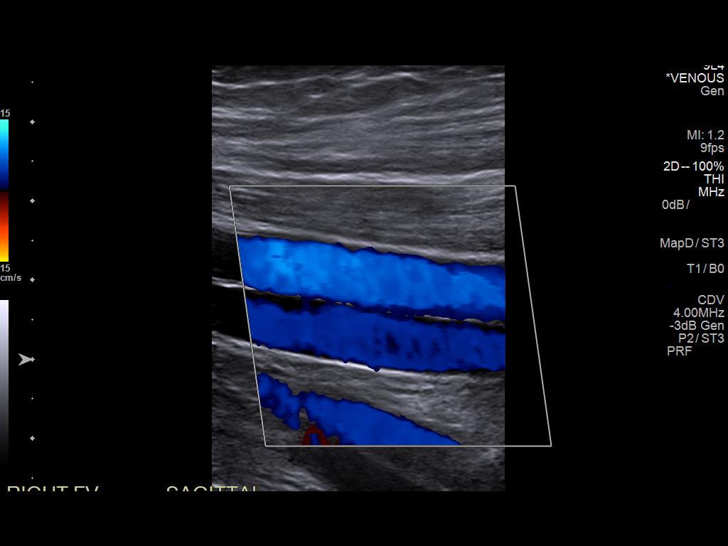
[im 21/40]
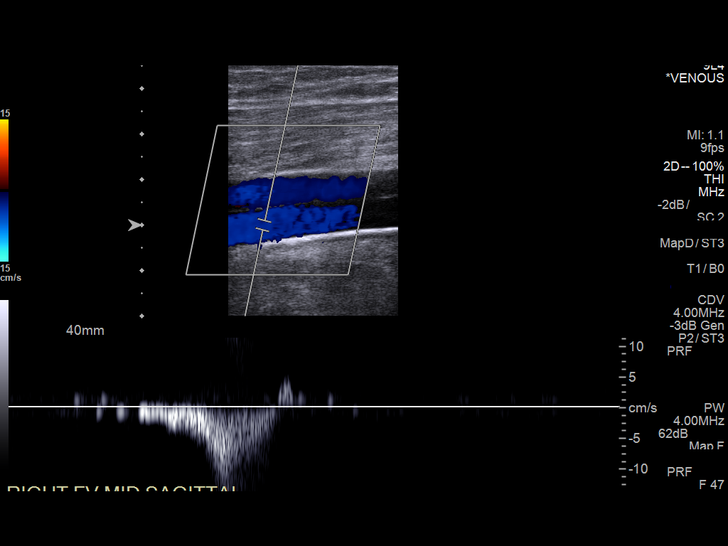
[im 23/40]
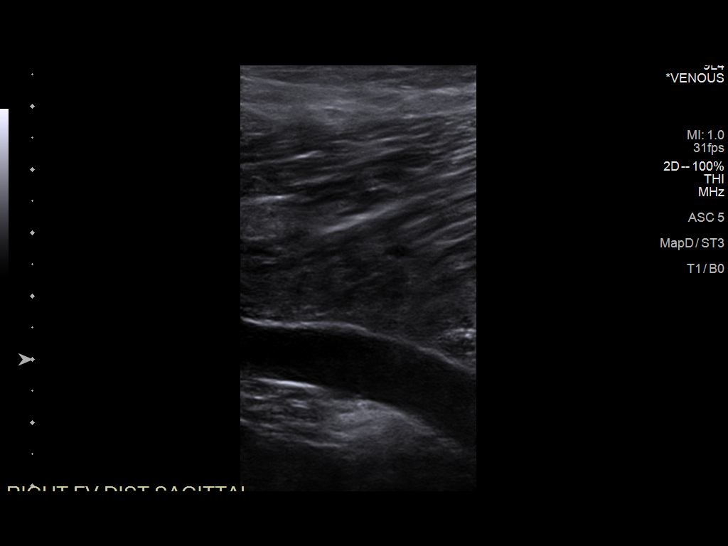
[im 26/40]
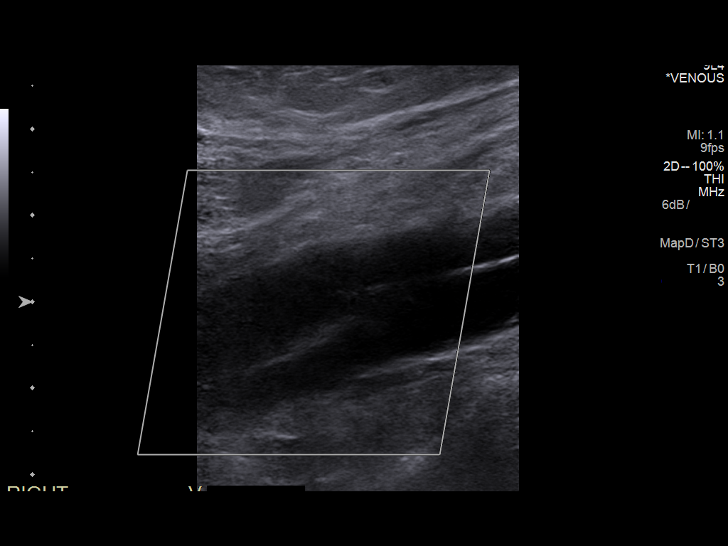
[im 29/40]
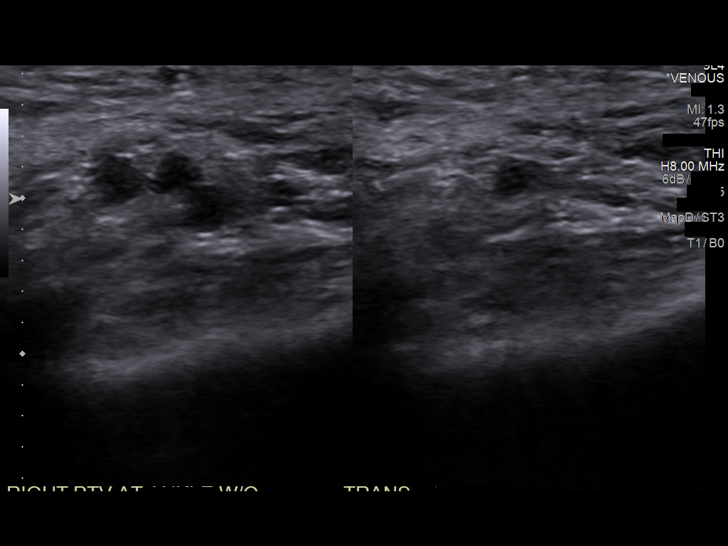
[im 33/40]
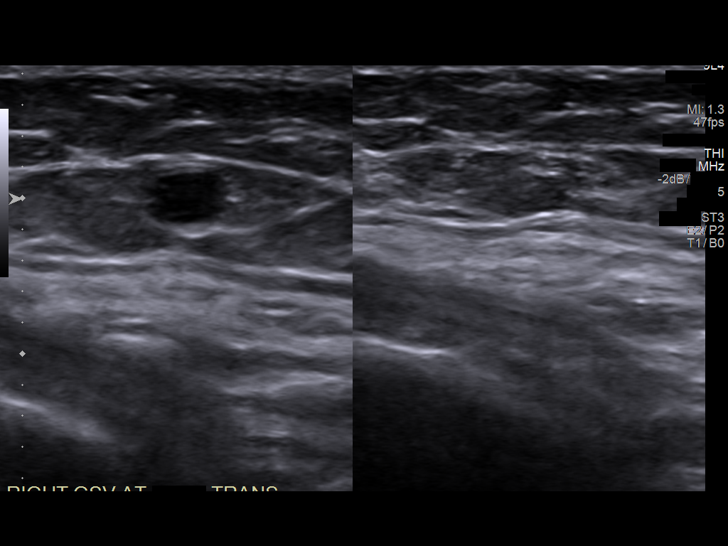
[im 36/40]
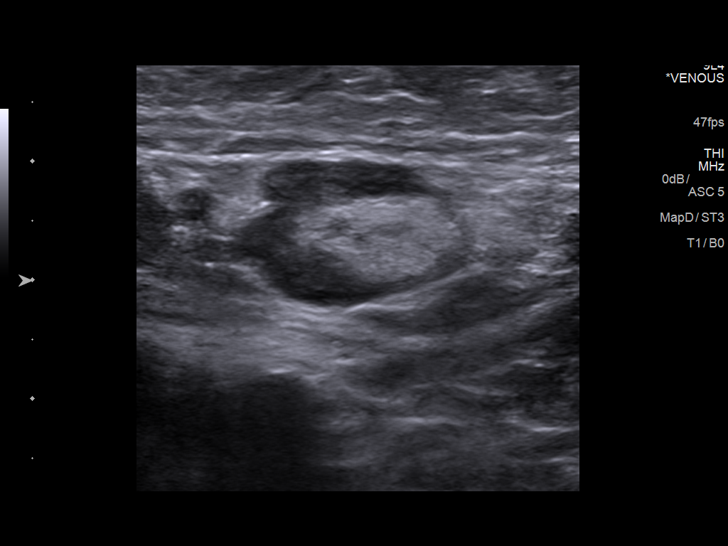
[im 40/40]
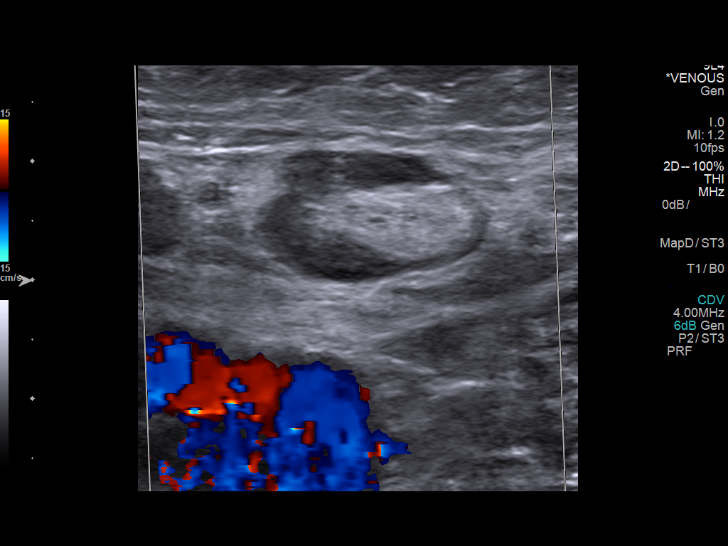

[13 of 24 positions shown; findings below may reference images not displayed]

FINDINGS: Contralateral Common Femoral Vein: Respiratory phasicity is normal
and symmetric with the symptomatic side. No evidence of thrombus.
Normal compressibility.

Common Femoral Vein: No evidence of thrombus. Normal
compressibility, respiratory phasicity and response to augmentation.

Saphenofemoral Junction: No evidence of thrombus. Normal
compressibility and flow on color Doppler imaging.

Profunda Femoral Vein: No evidence of thrombus. Normal
compressibility and flow on color Doppler imaging.

Femoral Vein: No evidence of thrombus. Normal compressibility,
respiratory phasicity and response to augmentation.

Popliteal Vein: No evidence of thrombus. Normal compressibility,
respiratory phasicity and response to augmentation.

Calf Veins: No evidence of thrombus. Normal compressibility and flow
on color Doppler imaging.

Superficial Great Saphenous Vein: No evidence of thrombus. Normal
compressibility and flow on color Doppler imaging.

Venous Reflux:  None.

Other Findings: Prominent lymph node is noted in the right inguinal
region with normal fatty hilus. This is likely reactive in nature to
the patient's known cellulitis.
IMPRESSION: No evidence of deep venous thrombosis.

## 2018-07-10 ENCOUNTER — Ambulatory Visit: Payer: BC Managed Care – PPO | Admitting: Family Medicine

## 2018-07-10 ENCOUNTER — Encounter: Payer: Self-pay | Admitting: Family Medicine

## 2018-07-10 VITALS — BP 142/91 | HR 82 | Temp 98.6°F | Resp 20 | Ht 67.0 in | Wt 222.0 lb

## 2018-07-10 DIAGNOSIS — H9120 Sudden idiopathic hearing loss, unspecified ear: Secondary | ICD-10-CM | POA: Diagnosis not present

## 2018-07-10 MED ORDER — PREDNISONE 20 MG PO TABS: ORAL_TABLET | ORAL | 0 refills | Status: DC

## 2018-07-10 NOTE — Patient Instructions (Signed)
Diagnosis: Sudden sensorineural hearing loss

## 2018-07-10 NOTE — Progress Notes (Signed)
OFFICE VISIT  07/10/2018   CC:  Chief Complaint  Patient presents with  . Ear Problem    x 1 week (right)    HPI:    Patient is a 57 y.o.  male who presents accompanied by his wife for decreased hearing in R ear x 1 week. [place], wind was blowing, he flew on a plane: he relates all these things to me today but admits he can't say that the hearing loss came after one particular thing.  Markedly decreased hearing + constant ringing in R ear. No drainage.  He did not swim in the ocean. No ear pain.  NO nasal congestion or runny nose.  No fevers.  No HA's.  No dizziness or vertigo. He has not sought medical care for this until now.  Past Medical History:  Diagnosis Date  . Cellulitis of right lower leg 10/2016   Failed outpt tx; hospitalized.  Leg venous doppler neg for DVT.  Marland Kitchen. Colon cancer screening 12/2016   Cologuard ordered/planned but insurance would not cover.  . Hyperlipidemia 11/2016   TLC recommended.  Marland Kitchen. NASH (nonalcoholic steatohepatitis) 10/2016   u/s during hospitalization for cellulitis 10/2016=fatty liver, LFTs elevated.  Viral Hep screen negative.    History reviewed. No pertinent surgical history.  Outpatient Medications Prior to Visit  Medication Sig Dispense Refill  . HYDROcodone-acetaminophen (NORCO/VICODIN) 5-325 MG tablet Take 1-2 tablets by mouth every 6 (six) hours as needed for moderate pain. 30 tablet 0   No facility-administered medications prior to visit.     No Known Allergies  ROS As per HPI  PE: Blood pressure (!) 142/91, pulse 82, temperature 98.6 F (37 C), resp. rate 20, height 5\' 7"  (1.702 m), weight 222 lb (100.7 kg), SpO2 96 %. Body mass index is 34.77 kg/m.  Gen: Alert, well appearing.  Patient is oriented to person, place, time, and situation. AFFECT: pleasant, lucid thought and speech. EARS: both EACs and TMs are normal. Weber: lateralizes to the "good" ear (left ear).  Rinne shows air>bone conduction on L  ear but NOT on R ear.  LABS:    Chemistry      Component Value Date/Time   NA 134 (L) 12/05/2016 0801   K 4.7 12/05/2016 0801   CL 102 12/05/2016 0801   CO2 28 12/05/2016 0801   BUN 13 12/05/2016 0801   CREATININE 1.18 12/05/2016 0801      Component Value Date/Time   CALCIUM 9.6 12/05/2016 0801   ALKPHOS 118 (H) 12/05/2016 0801   AST 35 12/05/2016 0801   ALT 61 (H) 12/05/2016 0801   BILITOT 0.6 12/05/2016 0801      Hearing Screening   Method: Audiometry   125Hz  250Hz  500Hz  1000Hz  2000Hz  3000Hz  4000Hz  6000Hz  8000Hz   Right ear:   75 75 75 75 75    Left ear:   30 40 20 50 60      IMPRESSION AND PLAN:  Sudden sensorineural hearing loss: started prednisone 60mg  qd x 5d. Ordered referral to ENT --to be done ASAP.  An After Visit Summary was printed and given to the patient.  FOLLOW UP: Return for as needed.  Signed:  Santiago BumpersPhil McGowen, MD           07/10/2018

## 2018-11-20 ENCOUNTER — Encounter: Payer: Self-pay | Admitting: Family Medicine

## 2018-11-20 ENCOUNTER — Ambulatory Visit: Payer: BC Managed Care – PPO | Admitting: Family Medicine

## 2018-11-20 VITALS — BP 142/88 | HR 87 | Temp 98.2°F | Resp 16 | Ht 67.0 in | Wt 227.2 lb

## 2018-11-20 DIAGNOSIS — G44219 Episodic tension-type headache, not intractable: Secondary | ICD-10-CM | POA: Diagnosis not present

## 2018-11-20 DIAGNOSIS — R030 Elevated blood-pressure reading, without diagnosis of hypertension: Secondary | ICD-10-CM | POA: Diagnosis not present

## 2018-11-20 DIAGNOSIS — B349 Viral infection, unspecified: Secondary | ICD-10-CM | POA: Diagnosis not present

## 2018-11-20 NOTE — Progress Notes (Signed)
OFFICE VISIT  12/01/2018   CC:  Chief Complaint  Patient presents with  . Sinusitis    x2 weeks ago with balance issues   . Headache    off and on x2 weeks    HPI:    Patient is a 58 y.o. Caucasian male who presents for "sinus headache". 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 The HA's' are not severe, not throbbing, not unilateral, not worsened by noise or light. He had to miss 3 days of work. He began to feel tired, body ached some.  When eyes track far left or right his head hurts a bit worse. No hearing deficit now.  No nausea or vomiting.  No sneezing or cough or ST. Somewhere during this period of time he felt subjective fever for a couple days. No diarrhea.   Currently he feels the frontal head pressure just a little when he moves his head.  ROS: no tick bites recalled.  No rash.  Past Medical History:  Diagnosis Date  . Cellulitis of right lower leg 10/2016   Failed outpt tx; hospitalized.  Leg venous doppler neg for DVT.  Marland Kitchen Colon cancer screening 12/2016   Cologuard ordered/planned but insurance would not cover.  . Hyperlipidemia 11/2016   TLC recommended.  Marland Kitchen NASH (nonalcoholic steatohepatitis) 10/2016   u/s during hospitalization for cellulitis 10/2016=fatty liver, LFTs elevated.  Viral Hep screen negative.    History reviewed. No pertinent surgical history.  Outpatient Medications Prior to Visit  Medication Sig Dispense Refill  . predniSONE (DELTASONE) 20 MG tablet 3 tabs po qd x 5d (Patient not taking: Reported on 11/20/2018) 15 tablet 0   No facility-administered medications prior to visit.     No Known Allergies  ROS As per HPI  PE: Blood pressure (!) 142/88, pulse 87, temperature 98.2 F (36.8 C), temperature source Oral, resp. rate 16, height 5\' 7"  (1.702 m), weight 227 lb 4 oz (103.1 kg), SpO2 97 %. Gen:  Alert, well appearing.  Patient is oriented to person, place, time, and situation. AFFECT: pleasant, lucid thought and speech. SVX:BLTJ: no injection, icteris, swelling, or exudate.  EOMI, PERRLA. Mouth: lips without lesion/swelling.  Oral mucosa pink and moist. Oropharynx without erythema, exudate, or swelling. No sinus tenderness. Neck - No masses or thyromegaly or limitation in range of motion CV: RRR, no m/r/g.   LUNGS: CTA bilat, nonlabored resps, good aeration in all lung fields. EXT: no clubbing or cyanosis.  no edema.  SKIN: no rash  LABS:    Chemistry      Component Value Date/Time   NA 134 (L) 12/05/2016 0801   K 4.7 12/05/2016 0801   CL 102 12/05/2016 0801   CO2 28 12/05/2016 0801   BUN 13 12/05/2016 0801   CREATININE 1.18 12/05/2016 0801      Component Value Date/Time   CALCIUM 9.6 12/05/2016 0801   ALKPHOS 118 (H) 12/05/2016 0801   AST 35 12/05/2016 0801   ALT 61 (H) 12/05/2016 0801   BILITOT 0.6 12/05/2016 0801      IMPRESSION AND PLAN:  1) Viral syndrome, slow to resolve.  Tension HA's associated/due to this. Low suspicion of acute bacterial sinusitis.  2) Tension HA's: pt is frustrated with his job lately-->stress induced + viral syndrome. Tylenol 1000 mg q6h prn.  3) Elevated bp w/out dx HTN; suspect stress + viral  syndrome causing this. Reviewed normal bp range with pt today. Recommended he check his bp daily for at least the next week.  Spent 30 min with pt today, with >50% of this time spent in counseling and care coordination regarding the above problems.  An After Visit Summary was printed and given to the patient.  FOLLOW UP: Return if symptoms worsen or fail to improve.  Signed:  Santiago Bumpers, MD           12/01/2018

## 2018-11-20 NOTE — Patient Instructions (Signed)
Take 1000 mg tylenol every 6 hours as needed for headaches.

## 2019-07-16 ENCOUNTER — Telehealth: Payer: Self-pay | Admitting: Family Medicine

## 2019-07-16 NOTE — Telephone Encounter (Signed)
Called patient and he will see a provider via VV. Patient said that he will need a work note. He would like it emailed or faxed to [facility] [place] location

## 2019-07-16 NOTE — Telephone Encounter (Signed)
Patient has been out of work for 3 days due to diarrhea which he no longer has. Patient would like to return to work but needs a doctor's note. He has no other complaints. If patient needs an appointment he can come on Friday. No openings on the schedule at this point.

## 2019-07-17 ENCOUNTER — Ambulatory Visit (INDEPENDENT_AMBULATORY_CARE_PROVIDER_SITE_OTHER): Payer: BC Managed Care – PPO | Admitting: Medical

## 2019-07-17 ENCOUNTER — Other Ambulatory Visit: Payer: Self-pay

## 2019-07-17 DIAGNOSIS — R195 Other fecal abnormalities: Secondary | ICD-10-CM

## 2019-07-17 NOTE — Progress Notes (Signed)
   Subjective:    Patient ID: Dean Aguirre, male    DOB: 12-Mar-1961, 58 y.o.   MRN: [id number]  HPI  Virtual Visit via Telephone Note  I connected with Dean Aguirre on 07/17/19 at  3:00 PM EDT by telephone and verified that I am speaking with the correct person using two identifiers.  Location: Patient: home Provider: office.   I discussed the limitations, risks, security and privacy concerns of performing an evaluation and management service by telephone and the availability of in person appointments. I also discussed with the patient that there may be a patient responsible charge related to this service. The patient expressed understanding and agreed to proceed.   History of Present Illness:  Pt states Monday, Tuesday, wed he had diarrhea(he had 1-2 loose watery stools those day.Marland Kitchen He states he wanted to return to work but work stated he needed not clearing him to return to work.  He wants to return to work this Monday. He had no other symptoms at time of loose stools. And currently asymptomatic.    Days had loose stools on 21st, 22 nd, and 23rd. No loose stool or any signs/symptoms since wed.  Pt did not check vitals today.     Observations/Objective:  General- no acute distress, pleasant, oriented.    Assessment and Plan:  Patient had 3 days of 1-2 loose stools from this Monday until Wednesday.  No other associated signs or symptoms on review.  Now asymptomatic since Wednesday.  His work requires him to have returned to work note with no restrictions.  I went ahead and wrote that note and also asked his work to excuse him for the days that he missed from work.  I am going to give the letter to receptionist upfront so they can fax it over to [facility] location.  Patient is going to stop by their office since that is close to his house.  Follow-up with PCP as regular scheduled or as needed.   Follow Up Instructions:    I discussed the assessment and treatment plan  with the patient. The patient was provided an opportunity to ask questions and all were answered. The patient agreed with the plan and demonstrated an understanding of the instructions.   The patient was advised to call back or seek an in-person evaluation if the symptoms worsen or if the condition fails to improve as anticipated.  I provided  25  minutes of non-face-to-face time during this encounter.   Mackie Pai, PA-C   Review of Systems  Constitutional: Negative for fatigue and fever.  Respiratory: Negative for cough, chest tightness and wheezing.   Cardiovascular: Negative for chest pain and palpitations.  Gastrointestinal: Negative for abdominal pain, blood in stool, diarrhea, nausea and vomiting.       See hpi.  Musculoskeletal: Negative for back pain, myalgias, neck pain and neck stiffness.  Psychiatric/Behavioral: Negative for behavioral problems. The patient is not nervous/anxious.        Objective:   Physical Exam        Assessment & Plan:

## 2019-07-17 NOTE — Patient Instructions (Addendum)
Patient had 3 days of 1-2 loose stools from this Monday until Wednesday.  No other associated signs or symptoms on review.  Now asymptomatic since Wednesday.  His work requires him to have returned to work note with no restrictions.  I went ahead and wrote that note and also asked his work to excuse him for the days that he missed from work.  I am going to give the letter to receptionist upfront so they can fax it over to [facility] location.  Patient is going to stop by their office since that is close to his house.  Follow-up with PCP as regular scheduled or as needed.

## 2020-12-07 NOTE — Progress Notes (Signed)
Virtual Visit via Video Note  I connected with Dean Aguirre on 12/08/20 at  4:00 PM EST by a video enabled telemedicine application and verified that I am speaking with the correct person using two identifiers.  Location patient: home,  Location provider:work or home office Persons participating in the virtual visit: patient, provider  I discussed the limitations of evaluation and management by telemedicine and the availability of in person appointments. The patient expressed understanding and agreed to proceed.  Telemedicine visit is a necessity given the COVID-19 restrictions in place at the current time.  HPI: 60 y/o male being seen today for recent illness. Few weeks of HAs, stuffy head/nose, little ST, little cough, some diffuse body aches. Appetite fine, sense of smell/taste are fine.   Multiple covid tests ALL NEG. OTC cold/cough med + NSAID/tylenol.  Had to take some time off work to rest up and get better. Feeling MUCH improved last few days.  Basically just some lingering mild body aches at this point. Needs note for his employer stating it is okay to return to work w/out restrictions 12/12/20.  ROS: See pertinent positives and negatives per HPI.  Past Medical History:  Diagnosis Date   Cellulitis of right lower leg 10/2016   Failed outpt tx; hospitalized.  Leg venous doppler neg for DVT.   Colon cancer screening 12/2016   Cologuard ordered/planned but insurance would not cover.   Hyperlipidemia 11/2016   TLC recommended.   NASH (nonalcoholic steatohepatitis) 10/2016   u/s during hospitalization for cellulitis 10/2016=fatty liver, LFTs elevated.  Viral Hep screen negative.    History reviewed. No pertinent surgical history.  MEDS: none  EXAM:  VITALS per patient if applicable:  Vitals with BMI 11/20/2018 11/20/2018 07/10/2018  Height - 5\' 7"  5\' 7"   Weight - 227 lbs 4 oz 222 lbs  BMI - 35.58 34.76  Systolic 142 159  Diastolic 88 84 91  Pulse - 87 82     GENERAL:  alert, oriented, appears well and in no acute distress  HEENT: atraumatic, conjunttiva clear, no obvious abnormalities on inspection of external nose and ears  NECK: normal movements of the head and neck  LUNGS: on inspection no signs of respiratory distress, breathing rate appears normal, no obvious gross SOB, gasping or wheezing  CV: no obvious cyanosis  MS: moves all visible extremities without noticeable abnormality  PSYCH/NEURO: pleasant and cooperative, no obvious depression or anxiety, speech and thought processing grossly intact  LABS: none today    Chemistry      Component Value Date/Time   NA 134 (L) 12/05/2016 0801   K 4.7 12/05/2016 0801   CL 102 12/05/2016 0801   CO2 28 12/05/2016 0801   BUN 13 12/05/2016 0801   CREATININE 1.18 12/05/2016 0801      Component Value Date/Time   CALCIUM 9.6 12/05/2016 0801   ALKPHOS 118 (H) 12/05/2016 0801   AST 35 12/05/2016 0801   ALT 61 (H) 12/05/2016 0801   BILITOT 0.6 12/05/2016 0801     ASSESSMENT AND PLAN:  Discussed the following assessment and plan:  Viral syndrome, resolving appropriately. No new treatments recommended at this time. Will do note for his employer stating it is ok for him to return to work w/out restrictions 12/12/20.    I discussed the assessment and treatment plan with the patient. The patient was provided an opportunity to ask questions and all were answered. The patient agreed with the plan and demonstrated an understanding of the instructions.  F/u: prn  Signed:  Santiago Bumpers, MD           12/08/2020

## 2020-12-08 ENCOUNTER — Encounter: Payer: Self-pay | Admitting: Family Medicine

## 2020-12-08 ENCOUNTER — Telehealth (INDEPENDENT_AMBULATORY_CARE_PROVIDER_SITE_OTHER): Payer: BC Managed Care – PPO | Admitting: Family Medicine

## 2020-12-08 DIAGNOSIS — B349 Viral infection, unspecified: Secondary | ICD-10-CM | POA: Diagnosis not present
